# Patient Record
Sex: Female | Born: 1995 | Race: Black or African American | Hispanic: No | Marital: Single | Smoking: Never smoker
Health system: Southern US, Community
[De-identification: ages and names within clinical notes are randomized; demographics above are authoritative.]

## PROBLEM LIST (undated history)

## (undated) DIAGNOSIS — O039 Complete or unspecified spontaneous abortion without complication: Secondary | ICD-10-CM

---

## 2013-11-27 ENCOUNTER — Encounter (HOSPITAL_COMMUNITY): Payer: Self-pay | Admitting: Emergency Medicine

## 2013-11-27 ENCOUNTER — Emergency Department (HOSPITAL_COMMUNITY)
Admission: EM | Admit: 2013-11-27 | Discharge: 2013-11-27 | Disposition: A | Discharge status: Home / Self Care | Payer: BC Managed Care – PPO | Attending: Emergency Medicine | Admitting: Emergency Medicine

## 2013-11-27 ENCOUNTER — Emergency Department (HOSPITAL_COMMUNITY): Payer: BC Managed Care – PPO

## 2013-11-27 DIAGNOSIS — Z880 Allergy status to penicillin: Secondary | ICD-10-CM | POA: Diagnosis not present

## 2013-11-27 DIAGNOSIS — M79671 Pain in right foot: Secondary | ICD-10-CM | POA: Insufficient documentation

## 2013-11-27 DIAGNOSIS — M79674 Pain in right toe(s): Secondary | ICD-10-CM | POA: Diagnosis present

## 2013-11-27 DIAGNOSIS — Z79899 Other long term (current) drug therapy: Secondary | ICD-10-CM | POA: Diagnosis not present

## 2013-11-27 NOTE — ED Provider Notes (Signed)
CSN: [id number]     Arrival date & time 11/27/13  1527 History  This chart was scribed for Jinny Sanders, PA-C working with Richardean Canal, MD by Evon Slack, ED Scribe. This patient was seen in room TR08C/TR08C and the patient's care was started at 4:09 PM.      Chief Complaint  Patient presents with  . Toe Pain   Patient is a 18 y.o. female presenting with toe pain. The history is provided by the patient. No language interpreter was used.  Toe Pain This is a new problem. The current episode started more than 1 week ago. The symptoms are aggravated by walking. Pam Krueger has tried nothing for the symptoms.   HPI Comments: Pam Krueger is a 18 y.o. female who presents to the Emergency Department complaining of intermittent right greater toe pain onset 3 months prior. Pam Krueger states Pam Krueger initially stepped on a ear ring 3 months ago. Pam Krueger states that her symptoms have worsened over the past 3 weeks. Pam Krueger states that the toe is painful to walk on. Pam Krueger states that the toe is almost more painful. Pam Krueger states that Pam Krueger noticed that over the weekend it was slightly green in color.   History reviewed. No pertinent past medical history. History reviewed. No pertinent past surgical history. History reviewed. No pertinent family history. History  Substance Use Topics  . Smoking status: Never Smoker   . Smokeless tobacco: Not on file  . Alcohol Use: No   OB History   Grav Para Term Preterm Abortions TAB SAB Ect Mult Living                 Review of Systems  Musculoskeletal: Positive for arthralgias (right great toe). Negative for gait problem.  Skin: Positive for color change.    Allergies  Penicillins  Home Medications   Prior to Admission medications   Medication Sig Start Date End Date Taking? Authorizing Provider  albuterol (PROVENTIL HFA;VENTOLIN HFA) 108 (90 BASE) MCG/ACT inhaler Inhale 1-2 puffs into the lungs every 6 (six) hours as needed for shortness of breath.   Yes Historical  Provider, MD  cetirizine (ZYRTEC) 10 MG tablet Take 10 mg by mouth daily.   Yes Historical Provider, MD   Triage Vitals: BP 99/57  Pulse 75  Temp(Src) 97.7 F (36.5 C) (Oral)  Resp 16  Ht 5' (1.524 m)  Wt 130 lb 4 oz (59.081 kg)  BMI 25.44 kg/m2  SpO2 97%  LMP 11/20/2013  Physical Exam  Nursing note and vitals reviewed. Constitutional: Pam Krueger is oriented to person, place, and time. Pam Krueger appears well-developed and well-nourished. No distress.  HENT:  Head: Normocephalic and atraumatic.  Eyes: Conjunctivae and EOM are normal.  Neck: Neck supple. No tracheal deviation present.  Cardiovascular: Normal rate.   Pulmonary/Chest: Effort normal. No respiratory distress.  Musculoskeletal: Normal range of motion.  Neurological: Pam Krueger is alert and oriented to person, place, and time.  Skin: Skin is warm and dry.  Right toe: 1/4 cm closed non erythematous, non-edematous, non-swollen, well-healed puncture wound. No obvious drainage, purulence, erythema, signs of infection.  Psychiatric: Pam Krueger has a normal mood and affect. Her behavior is normal.    ED Course  Procedures (including critical care time) DIAGNOSTIC STUDIES: Oxygen Saturation is 97% on RA, normal by my interpretation.    COORDINATION OF CARE: 4:10 PM-Discussed treatment plan which includes x ray of great right toe with pt at bedside and pt agreed to plan.     Labs Review Labs Reviewed -  No data to display  Imaging Review Dg Foot 2 Views Right  11/27/2013   CLINICAL DATA:  Right great toe pain for 3 months  EXAM: RIGHT FOOT - 2 VIEW  COMPARISON:  None.  FINDINGS: Two views of the right foot submitted. No acute fracture or subluxation. No periosteal reaction or bony erosion.  IMPRESSION: Negative.   Electronically Signed   By: Natasha MeadLiviu  Pop M.D.   On: 11/27/2013 16:53     EKG Interpretation None      MDM   Final diagnoses:  Foot pain, right   Patient presenting today with 3 months of pain in her great toe and her right  foot. Patient reports receiving a puncture wound in the toe after stepping on an earring. Patient reports being able to visualize the entire hearing when Pam Krueger pulled it out, did not notice a breaking off in her foot. Patient denies any erythema, warmth, drainage from her toe. Patient reports that Pam Krueger wears high heels on a regular basis, and high heels have been causing the wound to become painful. On exam there is a well-healed wound with obvious callus formation around where the puncture site was. No obvious signs of infection, injury. No signs of poorly healing wound tissue. Patient encouraged to use warm soaks, ibuprofen/Tylenol for pain, and to followup with primary care physician. I gave patient return precautions and encourage her to call or return to the ER should Pam Krueger have any questions or concerns.  BP 99/57  Pulse 75  Temp(Src) 97.7 F (36.5 C) (Oral)  Resp 16  Ht 5' (1.524 m)  Wt 130 lb 4 oz (59.081 kg)  BMI 25.44 kg/m2  SpO2 97%  LMP 11/20/2013  Signed,  Ladona MowJoe Yovanny Coats, PA-C 4:03 AM  I personally performed the services described in this documentation, which was scribed in my presence. The recorded information has been reviewed and is accurate.      Monte FantasiaJoseph W Secundino Ellithorpe, PA-C 11/28/13 [id number]

## 2013-11-27 NOTE — ED Notes (Signed)
Declined W/C at D/C and was escorted to lobby by RN. 

## 2013-11-27 NOTE — Discharge Instructions (Signed)

## 2013-11-27 NOTE — ED Notes (Signed)
Per pt sts that a few months ago stepped on an earring and right big toe still hurting.

## 2013-11-28 NOTE — ED Provider Notes (Signed)
Medical screening examination/treatment/procedure(s) were performed by non-physician practitioner and as supervising physician I was immediately available for consultation/collaboration.   EKG Interpretation None        Richardean Canalavid H Yao, MD 11/28/13 2202

## 2013-12-14 ENCOUNTER — Emergency Department (HOSPITAL_COMMUNITY)
Admission: EM | Admit: 2013-12-14 | Discharge: 2013-12-14 | Disposition: A | Discharge status: Home / Self Care | Payer: BC Managed Care – PPO | Attending: Emergency Medicine | Admitting: Emergency Medicine

## 2013-12-14 ENCOUNTER — Emergency Department (HOSPITAL_COMMUNITY): Payer: BC Managed Care – PPO

## 2013-12-14 ENCOUNTER — Encounter (HOSPITAL_COMMUNITY): Payer: Self-pay | Admitting: Emergency Medicine

## 2013-12-14 DIAGNOSIS — W19XXXA Unspecified fall, initial encounter: Secondary | ICD-10-CM

## 2013-12-14 DIAGNOSIS — B070 Plantar wart: Secondary | ICD-10-CM

## 2013-12-14 DIAGNOSIS — S3982XA Other specified injuries of lower back, initial encounter: Secondary | ICD-10-CM | POA: Diagnosis not present

## 2013-12-14 DIAGNOSIS — Z880 Allergy status to penicillin: Secondary | ICD-10-CM | POA: Diagnosis not present

## 2013-12-14 DIAGNOSIS — M545 Low back pain, unspecified: Secondary | ICD-10-CM

## 2013-12-14 DIAGNOSIS — W109XXA Fall (on) (from) unspecified stairs and steps, initial encounter: Secondary | ICD-10-CM | POA: Diagnosis not present

## 2013-12-14 DIAGNOSIS — S91202A Unspecified open wound of left great toe with damage to nail, initial encounter: Secondary | ICD-10-CM | POA: Insufficient documentation

## 2013-12-14 DIAGNOSIS — Z79899 Other long term (current) drug therapy: Secondary | ICD-10-CM | POA: Insufficient documentation

## 2013-12-14 DIAGNOSIS — Y9289 Other specified places as the place of occurrence of the external cause: Secondary | ICD-10-CM | POA: Diagnosis not present

## 2013-12-14 DIAGNOSIS — Y9389 Activity, other specified: Secondary | ICD-10-CM | POA: Insufficient documentation

## 2013-12-14 DIAGNOSIS — T1490XA Injury, unspecified, initial encounter: Secondary | ICD-10-CM

## 2013-12-14 DIAGNOSIS — S91209A Unspecified open wound of unspecified toe(s) with damage to nail, initial encounter: Secondary | ICD-10-CM

## 2013-12-14 DIAGNOSIS — S99921A Unspecified injury of right foot, initial encounter: Secondary | ICD-10-CM | POA: Diagnosis present

## 2013-12-14 MED ORDER — OXYCODONE-ACETAMINOPHEN 5-325 MG PO TABS
1.0000 | ORAL_TABLET | Freq: Once | ORAL | Status: AC
  Administered 2013-12-14: 1 via ORAL
  Filled 2013-12-14: qty 1

## 2013-12-14 MED ORDER — LIDOCAINE HCL (PF) 1 % IJ SOLN
5.0000 mL | Freq: Once | INTRAMUSCULAR | Status: AC
  Administered 2013-12-14: 5 mL via INTRADERMAL
  Filled 2013-12-14: qty 5

## 2013-12-14 NOTE — ED Notes (Signed)
Suture cart placed at bedside. 

## 2013-12-14 NOTE — ED Notes (Signed)
When tripped down steps, lt. toe nail (acrylic and natural) avulsed.  Mild bleeding. Back hurts a little bit. The foot got caught in the steps.

## 2013-12-14 NOTE — ED Provider Notes (Signed)
CSN: [id number]     Arrival date & time 12/14/13  1736 History  This chart was scribed for non-physician practitioner, Dierdre Forth, PA-C, working with Audree Camel, MD, by Bronson Curb, ED Scribe. This patient was seen in room TR09C/TR09C and the patient's care was started at 9:11 PM.     Chief Complaint  Patient presents with  . Nail Problem  . Toe Injury    The history is provided by the patient and medical records. No language interpreter was used.    HPI Comments: Pam Krueger is a 18 y.o. female who presents to the Emergency Department complaining of left great toe injury that occurred PTA. Patient states she tripped down the stairs when her toe,  became caught between the steps. She states that, in her attempt to dislodge her foot, the toe nail (acrylic and natural) detached from her toe. There is associated moderate pain and minimal bleeding at the site, in addition to mild back pain. She denies any numbness or tingling, no radiation of her back pain, no bowel or bladder incontinence, no weakness or saddle anesthesia. She denies head impact/injury or LOC. Patient has no history of significant health conditions.   History reviewed. No pertinent past medical history. History reviewed. No pertinent past surgical history. History reviewed. No pertinent family history. History  Substance Use Topics  . Smoking status: Never Smoker   . Smokeless tobacco: Not on file  . Alcohol Use: No   OB History   Grav Para Term Preterm Abortions TAB SAB Ect Mult Living                 Review of Systems  Constitutional: Negative for fever, chills and fatigue.  Respiratory: Negative for chest tightness and shortness of breath.   Cardiovascular: Negative for chest pain.  Gastrointestinal: Negative for nausea, vomiting, abdominal pain and diarrhea.  Genitourinary: Negative for dysuria, urgency, frequency and hematuria.  Musculoskeletal: Positive for arthralgias ( Left  great toe), back pain and myalgias (left great toe). Negative for gait problem, joint swelling, neck pain and neck stiffness.  Skin: Positive for wound (left great toe). Negative for rash.  Neurological: Negative for weakness, light-headedness, numbness and headaches.  Hematological: Does not bruise/bleed easily.  Psychiatric/Behavioral: The patient is not nervous/anxious.   All other systems reviewed and are negative.     Allergies  Penicillins  Home Medications   Prior to Admission medications   Medication Sig Start Date End Date Taking? Authorizing Provider  albuterol (PROVENTIL HFA;VENTOLIN HFA) 108 (90 BASE) MCG/ACT inhaler Inhale 1-2 puffs into the lungs every 6 (six) hours as needed for shortness of breath.    Historical Provider, MD  cetirizine (ZYRTEC) 10 MG tablet Take 10 mg by mouth daily.    Historical Provider, MD   BP 111/54  Pulse 91  Temp(Src) 98.6 F (37 C) (Oral)  Resp 18  SpO2 100%  LMP 11/20/2013 Physical Exam  Nursing note and vitals reviewed. Constitutional: She appears well-developed and well-nourished. No distress.  HENT:  Head: Normocephalic and atraumatic.  Mouth/Throat: Oropharynx is clear and moist. No oropharyngeal exudate.  Eyes: Conjunctivae are normal.  Neck: Normal range of motion. Neck supple.  Full ROM without pain  Cardiovascular: Normal rate, regular rhythm and intact distal pulses.   Capillary refill less than 3 seconds  Pulmonary/Chest: Effort normal and breath sounds normal. No respiratory distress. She has no wheezes.  Abdominal: Soft. She exhibits no distension. There is no tenderness.  Musculoskeletal: She exhibits tenderness.  She exhibits no edema.       Feet:  Full range of motion of the T-spine and L-spine No tenderness to palpation of the spinous processes of the T-spine or L-spine Very mild tenderness to bilateral palpation of the paraspinous muscles of the L-spine TTP of the left great toenail bed; nail partially avulsed  without laceration to the nailbed, mild TTP of the great toe, no joint line tenderness Plantar wart located on the plantar surface of the right great toe without secondary infection  Lymphadenopathy:    She has no cervical adenopathy.  Neurological: She is alert. She has normal reflexes. Coordination normal.  Reflex Scores:      Bicep reflexes are 2+ on the right side and 2+ on the left side.      Brachioradialis reflexes are 2+ on the right side and 2+ on the left side.      Patellar reflexes are 2+ on the right side and 2+ on the left side.      Achilles reflexes are 2+ on the right side and 2+ on the left side. Speech is clear and goal oriented, follows commands Normal 5/5 strength in upper and lower extremities bilaterally including dorsiflexion and plantar flexion, strong and equal grip strength Sensation normal to light and sharp touch Moves extremities without ataxia, coordination intact Normal gait Normal balance No Clonus   Skin: Skin is warm and dry. No rash noted. She is not diaphoretic. No erythema.  No tenting of the skin  Psychiatric: She has a normal mood and affect. Her behavior is normal.    ED Course  NAIL REMOVAL Date/Time: 12/14/2013 7:04 PM Performed by: Dierdre ForthMUTHERSBAUGH, Lashandra Arauz Authorized by: Dierdre ForthMUTHERSBAUGH, Teliyah Royal Consent: Verbal consent obtained. Risks and benefits: risks, benefits and alternatives were discussed Consent given by: patient Patient understanding: patient states understanding of the procedure being performed Patient consent: the patient's understanding of the procedure matches consent given Procedure consent: procedure consent matches procedure scheduled Relevant documents: relevant documents present and verified Site marked: the operative site was marked Imaging studies: imaging studies available Required items: required blood products, implants, devices, and special equipment available Patient identity confirmed: verbally with patient and arm  band Time out: Immediately prior to procedure a "time out" was called to verify the correct patient, procedure, equipment, support staff and site/side marked as required. Location: left foot Location details: left big toe Anesthesia: digital block Local anesthetic: lidocaine 1% without epinephrine Anesthetic total: 5 ml Patient sedated: no Preparation: skin prepped with alcohol Amount removed: complete Wedge excision of skin of nail fold: yes Nail bed sutured: no Nail matrix removed: none Removed nail replaced and anchored: no Dressing: petrolatum-impregnated gauze Patient tolerance: Patient tolerated the procedure well with no immediate complications. Comments: Removal of left great toenail, splinting of the nail fold with petroleum gauze without complication. No laceration to the nailbed.   (including critical care time)  DIAGNOSTIC STUDIES: Oxygen Saturation is 100% on room air, normal by my interpretation.    COORDINATION OF CARE: At 1825 Discussed treatment plan with patient which includes toe nail extraction and imaging. Patient agrees.   Labs Review Labs Reviewed - No data to display  Imaging Review Dg Toe Great Left  12/14/2013   CLINICAL DATA:  Acute left great toe pain with a nail bed injury after fall down stairs today. Initial encounter, trauma.  EXAM: LEFT GREAT TOE  COMPARISON:  None.  FINDINGS: No definite acute osseous or joint abnormality. The toenail is retracted from the nail bed.  IMPRESSION: 1. No definite osseous or joint abnormality. 2. Toenail is retracted from the nail bed.   Electronically Signed   By: Leanna BattlesMelinda  Blietz M.D.   On: 12/14/2013 18:48     EKG Interpretation None      Medications  lidocaine (PF) (XYLOCAINE) 1 % injection 5 mL (5 mLs Intradermal Given 12/14/13 1812)  oxyCODONE-acetaminophen (PERCOCET/ROXICET) 5-325 MG per tablet 1 tablet (1 tablet Oral Given 12/14/13 1812)     MDM   Final diagnoses:  Trauma  Toenail avulsion,  initial encounter  Plantar wart, right foot  Fall, initial encounter  Bilateral low back pain without sciatica   Marzetta BoardKiara Lowther Jenkins presents after fall with low back pain and left great toe avulsion.  Patient with back pain.  No neurological deficits and normal neuro exam.  Patient can walk without difficulty or pain.  No loss of bowel or bladder control.  No concern for cauda equina.  No midline tenderness, no indication for imaging at this time. No fever, night sweats, weight loss, h/o cancer, IVDU.  RICE protocol and pain medicine indicated and discussed with patient.   Patient with avulsion of left great toenail during fall. The toenail removed and nail folds splinted with petroleum gauze. He denied diabetes, HIV or immunosuppression to delay wound healing. No laceration to the nailbed.  Wound care discussed at length. Patient is to followup with her primary care, urgent care or back emergency department for worsening symptoms.  I have personally reviewed patient's vitals, nursing note and any pertinent labs or imaging.  I performed an focused physical exam; undressed when appropriate .    It has been determined that no acute conditions requiring further emergency intervention are present at this time. The patient/guardian have been advised of the diagnosis and plan. I reviewed any labs and imaging including any potential incidental findings. We have discussed signs and symptoms that warrant return to the ED and they are listed in the discharge instructions.    Vital signs are stable at discharge.   BP 111/54  Pulse 91  Temp(Src) 98.6 F (37 C) (Oral)  Resp 18  SpO2 100%  LMP 11/20/2013  I personally performed the services described in this documentation, which was scribed in my presence. The recorded information has been reviewed and is accurate.   Dahlia ClientHannah Malavika Lira, PA-C 12/14/13 2113

## 2013-12-14 NOTE — ED Notes (Signed)
Declined W/C at D/C and was escorted to lobby by RN. 

## 2013-12-14 NOTE — Discharge Instructions (Signed)
1. Medications: Tylenol or ibuprofen for pain, usual home medications 2. Treatment: ice for swelling, keep wound clean with warm soap and water and keep bandage dry, do not submerge in water for 24 hours 3. Follow Up: Please see your primary care physician in 3 days for wound check or sooner if you have concerns. Return to the emergency department for increased redness, drainage of pus from the wound   WOUND CARE  Keep area clean and dry for 24 hours. Do not remove bandage, if applied.  After 24 hours, remove bandage and wash wound gently with mild soap and warm water. Reapply a new bandage after cleaning wound, if directed.   Continue daily cleansing with soap and water until stitches/staples are removed.  Do not apply any ointments or creams to the wound while stitches/staples are in place, as this may cause delayed healing. Return if you experience any of the following signs of infection: Swelling, redness, pus drainage, streaking, fever >101.0 F  Return if you experience excessive bleeding that does not stop after 15-20 minutes of constant, firm pressure.    Emergency Department Resource Guide 1) Find a Doctor and Pay Out of Pocket Although you won't have to find out who is covered by your insurance plan, it is a good idea to ask around and get recommendations. You will then need to call the office and see if the doctor you have chosen will accept you as a new patient and what types of options they offer for patients who are self-pay. Some doctors offer discounts or will set up payment plans for their patients who do not have insurance, but you will need to ask so you aren't surprised when you get to your appointment.  2) Contact Your Local Health Department Not all health departments have doctors that can see patients for sick visits, but many do, so it is worth a call to see if yours does. If you don't know where your local health department is, you can check in your phone book. The  CDC also has a tool to help you locate your state's health department, and many state websites also have listings of all of their local health departments.  3) Find a Walk-in Clinic If your illness is not likely to be very severe or complicated, you may want to try a walk in clinic. These are popping up all over the country in pharmacies, drugstores, and shopping centers. They're usually staffed by nurse practitioners or physician assistants that have been trained to treat common illnesses and complaints. They're usually fairly quick and inexpensive. However, if you have serious medical issues or chronic medical problems, these are probably not your best option.  No Primary Care Doctor: - Call Health Connect at  [phone] - they can help you locate a primary care doctor that  accepts your insurance, provides certain services, etc. - Physician Referral Service- [phone]  Chronic Pain Problems: Organization         Address  Phone   Notes  [facility]  [phone] Patients need to be referred by their primary care doctor.   Medication Assistance: Organization         Address  Phone   Notes  [facility] [address] [place] [place], [place] [address] [phone] --Must be a resident of [facility] -- Must have NO insurance coverage whatsoever (no Medicaid/ Medicare, etc.) -- The pt. MUST have a primary care doctor that directs their  care regularly and follows them in the community   [facility]ist  [phone]   [employer]  [phone]) [phone]    Agencies that provide inexpensive medical care: Organization         Address  Phone   Notes  [facility]  [phone]   [facility]    [phone]) [phone]   [facility] [address] [place], [place] [address] [phone]) [phone]   [facility] [place] [address], [place] [phone]   [facility]anned Parenthood    [phone](336)  [phone]   [facility]    [phone]   [facility]  [address], [place] Phone:  [phone], Fax:  [phone] Hours of Operation:  9 am - 6 pm, M-F.  Also accepts Medicaid/Medicare and self-pay.  [facility]  [address], [place] Phone: [phone], Fax: [phone]. Hours of Operation:  8:30 am - 5:30 pm, M-F.  Also accepts Medicaid and self-pay.  [facility] [place] [address], [place] Phone: [phone]) [phone]   [facility] [address], [place], [place] [phone], Ext. [phone] Mondays & Thursdays: 7-9 AM.  First 15 patients are seen on a first come, first serve basis.    Medicaid-accepting [facility] Providers:  Organization         Address  Phone   Notes  [facility] [address], [place] [phone] Also accepts self-pay patients.  [facility] [address], [place]  [phone]) [phone]   [facility] [address], [place] [phone]) [phone]   [facility] [address] [place] [address], [place] [phone]) [phone]   Renaye RakersVeita Bland [address], [place]   [phone]) [phone] Only accepts [place] Access [place] patients after they have their name applied to their card.   Self-Pay (no insurance) in [facility]:  Organization         Address  Phone   Notes  Sickle Cell Patients, [facility] [address], [place] [phone]) [phone]   [facility] [address], [place] [phone]) [phone]   [facility] [place]  [address], [place] [phone]   [facility]/Dr. Osei-Bonsu  [address] [place] [address], [place] or [phone], [place] [phone]) [phone] Phone number for both [place] and [place] locations is the same.  [facility] [address], [place] [phone]   [facility] [address] [place] [address], [place] or [address] [phone]) [phone]   [facility] [address], [place] [phone]) [phone], phone; [phone](336) [phone], fax Sees patients 1st and 3rd Saturday of every month.  Must not qualify for public or private insurance (i.e. Medicaid, Medicare, [place] Health Choice, Veterans' Benefits)  Household income should be no more than 200% of the poverty level The clinic cannot treat you if you are pregnant or think you are pregnant  Sexually transmitted diseases are not treated at the clinic.    Dental Care: Organization         Address  Phone  Notes  [facility] of [facility] [address], [place] [phone]) [phone] Accepts children up to age  21 who are enrolled in Medicaid or [place] Health Choice; pregnant women with a Medicaid card; and children who have applied for Medicaid or [place] Health Choice, but were declined, whose parents can pay a reduced fee at time of service.  [facility] of [facility] [place]  [address], [place] [phone] Accepts children up to age 20 who are enrolled in [place] or [place] Health Choice; pregnant women with a Medicaid card; and children who have applied for Medicaid or [place] Health Choice, but were declined, whose parents can pay a reduced fee at time of service.  [facility]  [address], [place] [phone] Patients are seen by appointment only. Walk-ins are not accepted. [facility] will see patients 518 years of age and older. Monday - Tuesday (8am-5pm) Most Wednesdays (8:30-5pm) $30 per visit, cash only  [facility]  [address], [facility] [phone]) [phone] Patients are seen by appointment only. Walk-ins are not accepted. [facility] will see patients 20 years of age and older. One  Wednesday Evening (Monthly: Volunteer Based).  $30 per visit, cash only  [employer] [facility] [facility]  [phone] for adults; Children under age 204, call Graduate Pediatric Dentistry at [phone]. Children aged 84-14, please call [phone] to request a pediatric application.  Dental services are provided in all areas of dental care including fillings, crowns and bridges, complete and partial dentures, implants, gum treatment, root canals, and extractions. Preventive care is also provided. Treatment is provided to both adults and children. Patients are selected via a lottery and there is often a waiting list.   [facility] [address], [place]  [phone] [email or web address]   [facility] [address], [place], [place] [phone], Ext. [phone] Second and Fourth Thursday of each month, opens at 6:30 AM; Clinic ends at 9 AM.  Patients are seen on a first-come first-served basis, and a limited number are seen during each clinic.   [facility]  [address], [place], [place] [phone]) [phone]   Eligibility Requirements You must have lived in [place], [place], or [place] counties for at least the last three months.   You cannot be eligible for state or federal sponsored National Cityhealthcare insurance, including CIGNAVeterans Administration, [place]icaid, or [employer]Medicare.   You generally cannot be eligible for healthcare insurance through your employer.    How to apply: Eligibility screenings are held every Tuesday and Wednesday afternoon from 1:00 pm until 4:00 pm. You do not need an appointment for the interview!  [facility] [address], [place], [place] [phone]   [facility]  [phone]   [facility]  [phone]   [facility]  [phone]    Behavioral Health Resources in the Community: Intensive Outpatient Programs Organization          Address  Phone  Notes  [facility] [address], [place], [place] [phone]   [facility] [address], [place], [place] [phone]   [facility] [address], [place], [place]  [phone]   [facility] [address],  [place], [place] [phone] or [phone]   Substance Abuse Resources Organization         Address  Phone  Notes  [facility]  [phone]   [facility]  [phone]   [employer] [facility]  [phone]   Floydene Flock  [phone]   Residential & Outpatient Substance Abuse Program  [phone]   Psychological Services Organization         Address  Phone  Notes  [facility]  [phone]   [facility]  [phone]   [facility] [address], [place] [phone] or [phone]    Mobile Crisis Teams Organization         Address  Phone  Notes  [facility], Mobile Crisis Care Unit  [phone]   [facility]  [address] [place], [place] [phone]   Doristine Locks [address] [place] [phone]    Self-Help/Support Groups Organization         Address  Phone             Notes  [facility] [place] - variety of support groups  336- I7437963 Call for more information  Narcotics Anonymous (NA), [facility] [address], [employer] [place]  2 meetings at this location   Statistician         Address  Phone  Notes  [facility] [address],    [place]  [phone]   [facility]  [address], [place] [address], [place], [place] [phone]   [facility] [address] [place], [place] [phone] Admissions: 8am-3pm M-F  [facility] [address]    [place], [place] [phone]   [facility] [address] [place], [place], [place] [phone]   [facility] [address],  [place], [place] [phone]   [facility] - Intensive Outpatient [address], [place], [place] [phone]   [facility] ([facility] [address]  [place], [place] [phone] or [phone]   [facility] ([facility]) [address], [place], [place] [phone] Accepts Medicaid  [facility] [address]  [place] [phone] Substance Abuse/Addiction Treatment   [facility] Organization         Address  Phone  Notes  [facility]  [phone]   Angie Fava, PhD [address] [place], [place]   [phone] or [phone]   [facility]   [address] [place], [place] [phone]   [facility] [address] [place], [place] [phone] Insurance/Medicaid/sponsorship through [facility] and Families [address]                                    [place], [place] [phone] Therapy/tele-psych/case  [facility] [address], [place] [phone]    Dr. Lolly Mustache  [phone]   [facility] [place]  [employer] [facility]. 1) [address] [place] 2) [address], [place] 3)  [address], [place] [phone]  [phone]   [facility] Child Abuse Hotline ([phone] or [phone] (After Hours)

## 2013-12-16 NOTE — ED Provider Notes (Signed)
Medical screening examination/treatment/procedure(s) were performed by non-physician practitioner and as supervising physician I was immediately available for consultation/collaboration.   EKG Interpretation None        Erina Hamme T Osaze Hubbert, MD 12/16/13 1510 

## 2014-01-24 ENCOUNTER — Emergency Department (HOSPITAL_COMMUNITY): Payer: BC Managed Care – PPO

## 2014-01-24 ENCOUNTER — Encounter (HOSPITAL_COMMUNITY): Payer: Self-pay | Admitting: Family Medicine

## 2014-01-24 ENCOUNTER — Emergency Department (HOSPITAL_COMMUNITY)
Admission: EM | Admit: 2014-01-24 | Discharge: 2014-01-24 | Disposition: A | Discharge status: Home / Self Care | Payer: BC Managed Care – PPO | Attending: Emergency Medicine | Admitting: Emergency Medicine

## 2014-01-24 DIAGNOSIS — Z880 Allergy status to penicillin: Secondary | ICD-10-CM | POA: Insufficient documentation

## 2014-01-24 DIAGNOSIS — Z79899 Other long term (current) drug therapy: Secondary | ICD-10-CM | POA: Insufficient documentation

## 2014-01-24 DIAGNOSIS — O9989 Other specified diseases and conditions complicating pregnancy, childbirth and the puerperium: Secondary | ICD-10-CM | POA: Insufficient documentation

## 2014-01-24 DIAGNOSIS — R1084 Generalized abdominal pain: Secondary | ICD-10-CM | POA: Insufficient documentation

## 2014-01-24 DIAGNOSIS — N3000 Acute cystitis without hematuria: Secondary | ICD-10-CM

## 2014-01-24 DIAGNOSIS — Z3A09 9 weeks gestation of pregnancy: Secondary | ICD-10-CM | POA: Insufficient documentation

## 2014-01-24 DIAGNOSIS — Z3A00 Weeks of gestation of pregnancy not specified: Secondary | ICD-10-CM | POA: Diagnosis not present

## 2014-01-24 DIAGNOSIS — Z3490 Encounter for supervision of normal pregnancy, unspecified, unspecified trimester: Secondary | ICD-10-CM

## 2014-01-24 DIAGNOSIS — R109 Unspecified abdominal pain: Secondary | ICD-10-CM

## 2014-01-24 DIAGNOSIS — O2311 Infections of bladder in pregnancy, first trimester: Secondary | ICD-10-CM | POA: Diagnosis not present

## 2014-01-24 DIAGNOSIS — O210 Mild hyperemesis gravidarum: Secondary | ICD-10-CM | POA: Insufficient documentation

## 2014-01-24 LAB — URINE MICROSCOPIC-ADD ON

## 2014-01-24 LAB — CBC WITH DIFFERENTIAL/PLATELET
Basophils Absolute: 0 10*3/uL (ref 0.0–0.1)
Basophils Relative: 0 % (ref 0–1)
EOS PCT: 4 % (ref 0–5)
Eosinophils Absolute: 0.2 10*3/uL (ref 0.0–0.7)
HCT: 34.6 % — ABNORMAL LOW (ref 36.0–46.0)
Hemoglobin: 12.2 g/dL (ref 12.0–15.0)
LYMPHS ABS: 1.9 10*3/uL (ref 0.7–4.0)
Lymphocytes Relative: 35 % (ref 12–46)
MCH: 32.4 pg (ref 26.0–34.0)
MCHC: 35.3 g/dL (ref 30.0–36.0)
MCV: 92 fL (ref 78.0–100.0)
Monocytes Absolute: 0.5 10*3/uL (ref 0.1–1.0)
Monocytes Relative: 9 % (ref 3–12)
Neutro Abs: 2.9 10*3/uL (ref 1.7–7.7)
Neutrophils Relative %: 52 % (ref 43–77)
Platelets: 328 10*3/uL (ref 150–400)
RBC: 3.76 MIL/uL — ABNORMAL LOW (ref 3.87–5.11)
RDW: 11.6 % (ref 11.5–15.5)
WBC: 5.6 10*3/uL (ref 4.0–10.5)

## 2014-01-24 LAB — URINALYSIS, ROUTINE W REFLEX MICROSCOPIC
Bilirubin Urine: NEGATIVE
Glucose, UA: NEGATIVE mg/dL
HGB URINE DIPSTICK: NEGATIVE
Ketones, ur: 15 mg/dL — AB
Leukocytes, UA: NEGATIVE
Nitrite: POSITIVE — AB
Protein, ur: NEGATIVE mg/dL
SPECIFIC GRAVITY, URINE: 1.025 (ref 1.005–1.030)
Urobilinogen, UA: 2 mg/dL — ABNORMAL HIGH (ref 0.0–1.0)
pH: 7 (ref 5.0–8.0)

## 2014-01-24 LAB — WET PREP, GENITAL
CLUE CELLS WET PREP: NONE SEEN
TRICH WET PREP: NONE SEEN
WBC, Wet Prep HPF POC: NONE SEEN

## 2014-01-24 LAB — I-STAT BETA HCG BLOOD, ED (MC, WL, AP ONLY)

## 2014-01-24 LAB — BASIC METABOLIC PANEL
Anion gap: 13 (ref 5–15)
BUN: 4 mg/dL — ABNORMAL LOW (ref 6–23)
CALCIUM: 9 mg/dL (ref 8.4–10.5)
CO2: 18 meq/L — AB (ref 19–32)
CREATININE: 0.57 mg/dL (ref 0.50–1.10)
Chloride: 104 mEq/L (ref 96–112)
GFR calc Af Amer: >90 mL/min (ref >90)
GFR calc non Af Amer: >90 mL/min (ref >90)
GLUCOSE: 81 mg/dL (ref 70–99)
Potassium: 3.8 mEq/L (ref 3.7–5.3)
Sodium: 135 mEq/L — ABNORMAL LOW (ref 137–147)

## 2014-01-24 LAB — POC URINE PREG, ED: Preg Test, Ur: POSITIVE — AB

## 2014-01-24 MED ORDER — NITROFURANTOIN MONOHYD MACRO 100 MG PO CAPS: 100.0000 mg | ORAL_CAPSULE | Freq: Two times a day (BID) | ORAL | Status: DC

## 2014-01-24 NOTE — ED Notes (Signed)
Pt. Is stable upon d/c and will return home. Pt. Is ambulatory and was escorted off unit by this nurse. F/u care discussed with patient and she verbalized understanding via teachback method.

## 2014-01-24 NOTE — Discharge Instructions (Signed)
Please read and follow all provided instructions.  Your diagnoses today include:  1. Pregnancy   2. Abdominal pain   3. Acute cystitis without hematuria    Tests performed today include:  Blood counts and electrolytes  Blood tests to check kidney function  Urine test to look for infection and pregnancy (in women)  Ultrasound shows healthy pregnancy at 9 weeks and 5 days with due date of July 1st, 2016.   Vital signs. See below for your results today.   Medications prescribed:   Keflex (cephalexin) - antibiotic  You have been prescribed an antibiotic medicine: take the entire course of medicine even if you are feeling better. Stopping early can cause the antibiotic not to work.  Take any prescribed medications only as directed.  Home care instructions:   Follow any educational materials contained in this packet.  Follow-up instructions: Please follow-up with your primary care provider or gynecologist in the next 7 days for further evaluation of your symptoms.    Return instructions:  SEEK IMMEDIATE MEDICAL ATTENTION IF:  The pain does not go away or becomes severe   A temperature above 101F develops   Repeated vomiting occurs (multiple episodes)   The pain becomes localized to portions of the abdomen. The right side could possibly be appendicitis. In an adult, the left lower portion of the abdomen could be colitis or diverticulitis.   Blood is being passed in stools or vomit (bright red or black tarry stools)   You develop chest pain, difficulty breathing, dizziness or fainting, or become confused, poorly responsive, or inconsolable (young children)  If you have any other emergent concerns regarding your health  Additional Information: Abdominal (belly) pain can be caused by many things. Your caregiver performed an examination and possibly ordered blood/urine tests and imaging (CT scan, x-rays, ultrasound). Many cases can be observed and treated at home after  initial evaluation in the emergency department. Even though you are being discharged home, abdominal pain can be unpredictable. Therefore, you need a repeated exam if your pain does not resolve, returns, or worsens. Most patients with abdominal pain don't have to be admitted to the hospital or have surgery, but serious problems like appendicitis and gallbladder attacks can start out as nonspecific pain. Many abdominal conditions cannot be diagnosed in one visit, so follow-up evaluations are very important.  Your vital signs today were: BP 105/63 mmHg   Pulse 70   Temp(Src) 98.3 F (36.8 C)   Resp 17   SpO2 99%   LMP  If your blood pressure (bp) was elevated above 135/85 this visit, please have this repeated by your doctor within one month. --------------

## 2014-01-24 NOTE — ED Provider Notes (Signed)
4:30 PM: At shift change, hand-off report given by Rhea BleacherJosh Geiple, PA-C.  Pt awaiting lab results.  Plan includes discharge with aforementioned PA's plan is labs without significant abnormality.    5:15 PM: BMP and Wet prep resulted without significant abnormality.  Pt will be discharged with aforementioned PA's original plan.    Harle BattiestElizabeth Maze Corniel, NP 01/25/14 2354  Rolland PorterMark James, MD 01/29/14 2329

## 2014-01-24 NOTE — ED Provider Notes (Signed)
CSN: [id number]     Arrival date & time 01/24/14  1350 History   First MD Initiated Contact with Patient 01/24/14 1412     Chief Complaint  Patient presents with  . Abdominal Pain  . Nausea     (Consider location/radiation/quality/duration/timing/severity/associated sxs/prior Treatment) HPI Comments: Patient presents with complaint of generalized abdominal pain, worse lower for the past 4 weeks with associated nausea. Patient had one total episode of vomiting during this time. Abdominal pain is every day. Patient reports that she has had some episodes of severe lower abdominal pain and cramping. Patient reports shortness of breath with walking up a flight of stairs however does not have shortness of breath at rest. She has not had fever or cough. She has not had diarrhea. No dysuria patient admits to increased frequency. She has had more vaginal discharge than usual. No vaginal bleeding. Her last menstrual period was at least 4 weeks ago; she does not remember the exact date. No treatments PTA.   The history is provided by the patient.    History reviewed. No pertinent past medical history. History reviewed. No pertinent past surgical history. History reviewed. No pertinent family history. History  Substance Use Topics  . Smoking status: Never Smoker   . Smokeless tobacco: Not on file  . Alcohol Use: No   OB History    No data available     Review of Systems  Constitutional: Negative for fever.  HENT: Negative for rhinorrhea and sore throat.   Eyes: Negative for redness.  Respiratory: Positive for shortness of breath. Negative for cough.   Cardiovascular: Negative for chest pain.  Gastrointestinal: Positive for nausea, vomiting and abdominal pain. Negative for diarrhea.  Genitourinary: Positive for frequency and vaginal discharge. Negative for dysuria, vaginal bleeding and vaginal pain.  Musculoskeletal: Negative for myalgias.  Skin: Negative for rash.  Neurological: Negative  for headaches.   Allergies  Penicillins  Home Medications   Prior to Admission medications   Medication Sig Start Date End Date Taking? Authorizing Provider  albuterol (PROVENTIL HFA;VENTOLIN HFA) 108 (90 BASE) MCG/ACT inhaler Inhale 1-2 puffs into the lungs every 6 (six) hours as needed for shortness of breath.    Historical Provider, MD  cetirizine (ZYRTEC) 10 MG tablet Take 10 mg by mouth daily.    Historical Provider, MD   BP 113/53 mmHg  Pulse 74  Temp(Src) 98.3 F (36.8 C)  Resp 16  SpO2 100%  LMP    Physical Exam  Constitutional: She appears well-developed and well-nourished.  HENT:  Head: Normocephalic and atraumatic.  Eyes: Conjunctivae are normal. Right eye exhibits no discharge. Left eye exhibits no discharge.  Neck: Normal range of motion. Neck supple.  Cardiovascular: Normal rate, regular rhythm and normal heart sounds.   No murmur heard. Pulmonary/Chest: Effort normal and breath sounds normal. No respiratory distress. She has no wheezes. She has no rales.  Abdominal: Soft. She exhibits no distension. There is tenderness (mild, lower). There is no rebound and no guarding.  Genitourinary: Uterus is enlarged. Cervix exhibits no motion tenderness, no discharge and no friability. Right adnexum displays no mass and no tenderness. Left adnexum displays no mass and no tenderness. No bleeding in the vagina. Vaginal discharge (scant, thick white) found.  Neurological: She is alert.  Skin: Skin is warm and dry.  Psychiatric: She has a normal mood and affect.  Nursing note and vitals reviewed.   ED Course  Procedures (including critical care time) Labs Review Labs Reviewed  URINALYSIS, ROUTINE  W REFLEX MICROSCOPIC - Abnormal; Notable for the following:    Color, Urine AMBER (*)    APPearance CLOUDY (*)    Ketones, ur 15 (*)    Urobilinogen, UA 2.0 (*)    Nitrite POSITIVE (*)    All other components within normal limits  CBC WITH DIFFERENTIAL - Abnormal; Notable for  the following:    RBC 3.76 (*)    HCT 34.6 (*)    All other components within normal limits  URINE MICROSCOPIC-ADD ON - Abnormal; Notable for the following:    Squamous Epithelial / LPF MANY (*)    Bacteria, UA MANY (*)    All other components within normal limits  POC URINE PREG, ED - Abnormal; Notable for the following:    Preg Test, Ur POSITIVE (*)    All other components within normal limits  WET PREP, GENITAL  GC/CHLAMYDIA PROBE AMP  URINE CULTURE  BASIC METABOLIC PANEL  I-STAT BETA HCG BLOOD, ED ([facility], [facility], [facility] ONLY)    Imaging Review [place] Ob Comp Less 14 Wks  01/24/2014   CLINICAL DATA:  Pregnancy, abdominal pain.  EXAM: OBSTETRIC <14 WK US AND TRANSVAGINAL OB US  TECHNIQUE: Both transabdominal and transvaginal ultrasound examinations were performed for complete evaluation of the gestation as well as the maternal uterus, adnexal regions, and pelvic cul-de-sac. Transvaginal technique was performed to assess early pregnancy.  COMPARISON:  None.  FINDINGS: Intrauterine gestational sac: Visualized/normal in shape.  Yolk sac:  Visualized.  Embryo:  Visualized.  Cardiac Activity: Visualized.  Heart Rate:  173 bpm  CRL:   28.3  mm   9 w 5 d                  US EDC: August 24, 2014.  Maternal uterus/adnexae: Ovaries appear normal. No hemorrhage is noted.  IMPRESSION: Single live intrauterine gestation of 9 weeks 5 days.   Electronically Signed   By: Roque LiasJames  Green M.D.   On: 01/24/2014 15:58   [place] Ob Transvaginal  01/24/2014   CLINICAL DATA:  Pregnancy, abdominal pain.  EXAM: OBSTETRIC <14 WK US AND TRANSVAGINAL OB US  TECHNIQUE: Both transabdominal and transvaginal ultrasound examinations were performed for complete evaluation of the gestation as well as the maternal uterus, adnexal regions, and pelvic cul-de-sac. Transvaginal technique was performed to assess early pregnancy.  COMPARISON:  None.  FINDINGS: Intrauterine gestational sac: Visualized/normal in shape.  Yolk sac:  Visualized.  Embryo:   Visualized.  Cardiac Activity: Visualized.  Heart Rate:  173 bpm  CRL:   28.3  mm   9 w 5 d                  US EDC: August 24, 2014.  Maternal uterus/adnexae: Ovaries appear normal. No hemorrhage is noted.  IMPRESSION: Single live intrauterine gestation of 9 weeks 5 days.   Electronically Signed   By: Roque LiasJames  Green M.D.   On: 01/24/2014 15:58     EKG Interpretation None       2:42 PM Patient seen and examined. Patient is currently comfortable, but with report of lower abd pain, severe at times, will get US to ensure uterine pregnancy.   Vital signs reviewed and are as follows: BP 113/53 mmHg  Pulse 74  Temp(Src) 98.3 F (36.8 C)  Resp 16  SpO2 100%  LMP   4:22 PM Pt informed of imaging results. UA shows UTI and will treat given pregnancy.   Pelvic performed by Brixius PA-S2 under my supervision.  Pending blood/wet prep results. Handoff to [employer] PA-C who will check on pending results. Otherwise plan as above. She appears well, stable.    MDM   Final diagnoses:  Abdominal pain  Pregnancy  Acute cystitis without hematuria   Abdominal pain in pregnancy. UTI noted, culture sent. IUP confirmed. Pt safe for dc to home. No concern for intraabominal problem given patient's history.    Renne CriglerJoshua Chaselyn Nanney, PA-C 01/24/14 1632  Tilden FossaElizabeth Rees, MD 01/24/14 [phone]

## 2014-01-24 NOTE — ED Notes (Signed)
Per pt sts 4 weeks of abdominal pain and nausea. sts generalized.

## 2014-01-25 LAB — URINE CULTURE
COLONY COUNT: NO GROWTH
CULTURE: NO GROWTH
Special Requests: NORMAL

## 2014-01-25 LAB — GC/CHLAMYDIA PROBE AMP
CT PROBE, AMP APTIMA: NEGATIVE
GC Probe RNA: NEGATIVE

## 2015-07-07 ENCOUNTER — Emergency Department (HOSPITAL_COMMUNITY): Payer: Managed Care, Other (non HMO)

## 2015-07-07 ENCOUNTER — Emergency Department (HOSPITAL_COMMUNITY)
Admission: EM | Admit: 2015-07-07 | Discharge: 2015-07-07 | Disposition: A | Discharge status: Home / Self Care | Payer: Managed Care, Other (non HMO) | Attending: Emergency Medicine | Admitting: Emergency Medicine

## 2015-07-07 ENCOUNTER — Encounter (HOSPITAL_COMMUNITY): Payer: Self-pay | Admitting: *Deleted

## 2015-07-07 DIAGNOSIS — J45909 Unspecified asthma, uncomplicated: Secondary | ICD-10-CM | POA: Diagnosis not present

## 2015-07-07 DIAGNOSIS — Z880 Allergy status to penicillin: Secondary | ICD-10-CM | POA: Insufficient documentation

## 2015-07-07 DIAGNOSIS — Z793 Long term (current) use of hormonal contraceptives: Secondary | ICD-10-CM | POA: Insufficient documentation

## 2015-07-07 DIAGNOSIS — O23591 Infection of other part of genital tract in pregnancy, first trimester: Secondary | ICD-10-CM | POA: Insufficient documentation

## 2015-07-07 DIAGNOSIS — O9989 Other specified diseases and conditions complicating pregnancy, childbirth and the puerperium: Secondary | ICD-10-CM | POA: Diagnosis present

## 2015-07-07 DIAGNOSIS — N3090 Cystitis, unspecified without hematuria: Secondary | ICD-10-CM

## 2015-07-07 DIAGNOSIS — Z3490 Encounter for supervision of normal pregnancy, unspecified, unspecified trimester: Secondary | ICD-10-CM

## 2015-07-07 DIAGNOSIS — R102 Pelvic and perineal pain: Secondary | ICD-10-CM

## 2015-07-07 DIAGNOSIS — O99511 Diseases of the respiratory system complicating pregnancy, first trimester: Secondary | ICD-10-CM | POA: Insufficient documentation

## 2015-07-07 DIAGNOSIS — Z79899 Other long term (current) drug therapy: Secondary | ICD-10-CM | POA: Diagnosis not present

## 2015-07-07 DIAGNOSIS — N760 Acute vaginitis: Secondary | ICD-10-CM

## 2015-07-07 DIAGNOSIS — Z3A01 Less than 8 weeks gestation of pregnancy: Secondary | ICD-10-CM | POA: Diagnosis not present

## 2015-07-07 DIAGNOSIS — B9689 Other specified bacterial agents as the cause of diseases classified elsewhere: Secondary | ICD-10-CM

## 2015-07-07 HISTORY — DX: Complete or unspecified spontaneous abortion without complication: O03.9

## 2015-07-07 LAB — WET PREP, GENITAL
Sperm: NONE SEEN
Trich, Wet Prep: NONE SEEN
WBC WET PREP: NONE SEEN
Yeast Wet Prep HPF POC: NONE SEEN

## 2015-07-07 LAB — RAPID HIV SCREEN (HIV 1/2 AB+AG)
HIV 1/2 Antibodies: NONREACTIVE
HIV-1 P24 Antigen - HIV24: NONREACTIVE

## 2015-07-07 LAB — COMPREHENSIVE METABOLIC PANEL
ALBUMIN: 3.1 g/dL — AB (ref 3.5–5.0)
ALK PHOS: 51 U/L (ref 38–126)
ALT: 13 U/L — ABNORMAL LOW (ref 14–54)
AST: 20 U/L (ref 15–41)
Anion gap: 8 (ref 5–15)
BILIRUBIN TOTAL: 0.9 mg/dL (ref 0.3–1.2)
CALCIUM: 8.8 mg/dL — AB (ref 8.9–10.3)
CO2: 20 mmol/L — AB (ref 22–32)
Chloride: 106 mmol/L (ref 101–111)
Creatinine, Ser: 0.79 mg/dL (ref 0.44–1.00)
GFR calc Af Amer: >60 mL/min (ref >60)
GFR calc non Af Amer: >60 mL/min (ref >60)
GLUCOSE: 98 mg/dL (ref 65–99)
POTASSIUM: 3.3 mmol/L — AB (ref 3.5–5.1)
Sodium: 134 mmol/L — ABNORMAL LOW (ref 135–145)
TOTAL PROTEIN: 6.7 g/dL (ref 6.5–8.1)

## 2015-07-07 LAB — URINE MICROSCOPIC-ADD ON: RBC / HPF: NONE SEEN RBC/hpf (ref 0–5)

## 2015-07-07 LAB — URINALYSIS, ROUTINE W REFLEX MICROSCOPIC
BILIRUBIN URINE: NEGATIVE
GLUCOSE, UA: NEGATIVE mg/dL
HGB URINE DIPSTICK: NEGATIVE
KETONES UR: NEGATIVE mg/dL
Leukocytes, UA: NEGATIVE
NITRITE: POSITIVE — AB
PH: 6.5 (ref 5.0–8.0)
Protein, ur: NEGATIVE mg/dL
Specific Gravity, Urine: 1.024 (ref 1.005–1.030)

## 2015-07-07 LAB — CBC
HEMATOCRIT: 32.1 % — AB (ref 36.0–46.0)
Hemoglobin: 11.4 g/dL — ABNORMAL LOW (ref 12.0–15.0)
MCH: 32.9 pg (ref 26.0–34.0)
MCHC: 35.5 g/dL (ref 30.0–36.0)
MCV: 92.5 fL (ref 78.0–100.0)
Platelets: 339 10*3/uL (ref 150–400)
RBC: 3.47 MIL/uL — ABNORMAL LOW (ref 3.87–5.11)
RDW: 11.5 % (ref 11.5–15.5)
WBC: 7.2 10*3/uL (ref 4.0–10.5)

## 2015-07-07 LAB — HCG, SERUM, QUALITATIVE: Preg, Serum: POSITIVE — AB

## 2015-07-07 LAB — LIPASE, BLOOD: LIPASE: 26 U/L (ref 11–51)

## 2015-07-07 LAB — PREGNANCY, URINE: PREG TEST UR: POSITIVE — AB

## 2015-07-07 MED ORDER — FOSFOMYCIN TROMETHAMINE 3 G PO PACK: 3.0000 g | PACK | Freq: Once | ORAL | Status: AC

## 2015-07-07 MED ORDER — METRONIDAZOLE 500 MG PO TABS: 500.0000 mg | ORAL_TABLET | Freq: Two times a day (BID) | ORAL | Status: AC

## 2015-07-07 NOTE — ED Provider Notes (Signed)
CSN: [id number]     Arrival date & time 07/07/15  1431 History   First MD Initiated Contact with Patient 07/07/15 1724     Chief Complaint  Patient presents with  . Abdominal Pain     (Consider location/radiation/quality/duration/timing/severity/associated sxs/prior Treatment) HPI Comments: Storm Dulski is a 20 y.o. Female G1P0010 with history of asthma presents to ED with complaint of lower abdominal pain and positive home pregnancy test. Lower abdominal pain started one week ago, 7/10 on pain scale, sharp in nature, intermittent, and with no radiation. Patient took a home pregnancy test prior to coming to ED and test was positive. LMP 06/03/15. Patient denies any vaginal pain, pelvic pain, vaginal bleeding, or discharge. Patient reports one previous pregnancy resulting in elective D&C approximately 1.5 years ago. She is sexually active with two female partners in the last 6 months. She uses condoms with one partner, no contraceptive methods with other partner. Patient denies history of sexually transmitted infections, but would like to be tested today. She denies any other gynecologic history of surgeries. No abdominal surgeries.   Endorses dark color to urine, increase frequency, and malodorous urine. Denies dysuria. No fever, chills, or night sweats. No diarrhea or constipation.   Patient is a 20 y.o. female presenting with abdominal pain. The history is provided by the patient.  Abdominal Pain Pain location:  Suprapubic Pain quality: sharp   Pain radiates to:  Does not radiate Duration:  1 week Timing:  Intermittent Associated symptoms: no chest pain, no chills, no constipation, no diarrhea, no dysuria, no fever, no hematuria, no nausea, no shortness of breath, no sore throat, no vaginal bleeding, no vaginal discharge and no vomiting     Past Medical History  Diagnosis Date  . Abortion     elective   History reviewed. No pertinent past surgical history. No family history on  file. Social History  Substance Use Topics  . Smoking status: Never Smoker   . Smokeless tobacco: None  . Alcohol Use: Yes     Comment: occ   OB History    No data available     Review of Systems  Constitutional: Negative for fever, chills and diaphoresis.  HENT: Negative for sore throat and trouble swallowing.   Eyes: Negative for visual disturbance.  Respiratory: Negative for shortness of breath.   Cardiovascular: Negative for chest pain.  Gastrointestinal: Positive for abdominal pain. Negative for nausea, vomiting, diarrhea, constipation and blood in stool.  Genitourinary: Positive for frequency. Negative for dysuria, hematuria, vaginal bleeding, vaginal discharge, vaginal pain and pelvic pain.  Musculoskeletal: Negative for neck pain and neck stiffness.  Skin: Negative for rash.  Neurological: Negative for dizziness, light-headedness and headaches.      Allergies  Penicillins  Home Medications   Prior to Admission medications   Medication Sig Start Date End Date Taking? Authorizing Provider  albuterol (PROVENTIL HFA;VENTOLIN HFA) 108 (90 BASE) MCG/ACT inhaler Inhale 1-2 puffs into the lungs every 6 (six) hours as needed for shortness of breath.   Yes Historical Provider, MD  cetirizine (ZYRTEC) 10 MG tablet Take 10 mg by mouth daily.   Yes Historical Provider, MD  diphenhydrAMINE (BENADRYL) 25 mg capsule Take 50 mg by mouth daily as needed for allergies.   Yes Historical Provider, MD  Norgestimate-Ethinyl Estradiol Triphasic 0.18/0.215/0.25 MG-35 MCG tablet Take 1 tablet by mouth daily. 01/18/15  Yes Historical Provider, MD  Pediatric Multiple Vit-C-FA (FLINSTONES GUMMIES OMEGA-3 DHA) CHEW Chew 1 tablet by mouth daily.   Yes  Historical Provider, MD  fosfomycin (MONUROL) 3 g PACK Take 3 g by mouth once. 07/07/15   Lona Kettle, PA-C  metroNIDAZOLE (FLAGYL) 500 MG tablet Take 1 tablet (500 mg total) by mouth 2 (two) times daily. 07/07/15   Lona Kettle, PA-C    BP 117/88 mmHg  Pulse 84  Temp(Src) 98.6 F (37 C) (Oral)  Resp 16  Ht 5\' 1"  (1.549 m)  Wt 57.607 kg  BMI 24.01 kg/m2  SpO2 100%  LMP 07/04/2015 Physical Exam  Constitutional: She appears well-developed and well-nourished. No distress.  HENT:  Head: Normocephalic and atraumatic.  Mouth/Throat: Oropharynx is clear and moist. No oropharyngeal exudate.  Eyes: Conjunctivae and EOM are normal. Pupils are equal, round, and reactive to light. Right eye exhibits no discharge. Left eye exhibits no discharge. No scleral icterus.  Neck: Normal range of motion. Neck supple.  Cardiovascular: Normal rate, regular rhythm, normal heart sounds and intact distal pulses.   No murmur heard. Pulmonary/Chest: Effort normal and breath sounds normal. No respiratory distress.  Abdominal: Soft. Bowel sounds are normal. There is tenderness. There is no rebound, no guarding and no CVA tenderness.  No abdominal distension or rashes noted. Positive bowel sounds. Abdomen is soft with mild TTP of LLQ and suprapubic region. No guarding or rigidity.   Genitourinary: Vagina normal. There is no rash, tenderness or injury on the right labia. There is no rash, tenderness, lesion or injury on the left labia. Cervix exhibits discharge. Cervix exhibits no motion tenderness and no friability.  Chaperone was present for duration of exam. No lacerations, lesions, or masses noted on labia. No lacerations, lesions, or masses noted in vaginal cavity. Cervix closed, non-friable, with white discharge noted. No bloody discharge. No CMT on bimanual exam.   Musculoskeletal: Normal range of motion.  Lymphadenopathy:    She has no cervical adenopathy.  Neurological: She is alert. Coordination normal.  Skin: Skin is warm and dry. She is not diaphoretic.  Psychiatric: She has a normal mood and affect. Her behavior is normal.    ED Course  Procedures (including critical care time) Labs Review Labs Reviewed  WET PREP, GENITAL -  Abnormal; Notable for the following:    Clue Cells Wet Prep HPF POC PRESENT (*)    All other components within normal limits  COMPREHENSIVE METABOLIC PANEL - Abnormal; Notable for the following:    Sodium 134 (*)    Potassium 3.3 (*)    CO2 20 (*)    BUN <5 (*)    Calcium 8.8 (*)    Albumin 3.1 (*)    ALT 13 (*)    All other components within normal limits  CBC - Abnormal; Notable for the following:    RBC 3.47 (*)    Hemoglobin 11.4 (*)    HCT 32.1 (*)    All other components within normal limits  URINALYSIS, ROUTINE W REFLEX MICROSCOPIC (NOT AT [facility]) - Abnormal; Notable for the following:    APPearance CLOUDY (*)    Nitrite POSITIVE (*)    All other components within normal limits  URINE MICROSCOPIC-ADD ON - Abnormal; Notable for the following:    Squamous Epithelial / LPF 0-5 (*)    Bacteria, UA MANY (*)    All other components within normal limits  PREGNANCY, URINE - Abnormal; Notable for the following:    Preg Test, Ur POSITIVE (*)    All other components within normal limits  HCG, SERUM, QUALITATIVE - Abnormal; Notable for the following:  Preg, Serum POSITIVE (*)    All other components within normal limits  LIPASE, BLOOD  RAPID HIV SCREEN (HIV 1/2 AB+AG)  RPR  I-STAT BETA HCG BLOOD, ED ([facility], [facility], [facility] ONLY)  GC/CHLAMYDIA PROBE AMP ([place] NOT [facility]    Imaging Review [place] Ob Comp Less 14 Wks  07/07/2015  CLINICAL DATA:  Lower abdominal pain for 1 week. Estimated gestational age by LMP is 4 weeks 6 days. Quantitative beta HCG is 72. EXAM: OBSTETRIC <14 WK US AND TRANSVAGINAL OB US TECHNIQUE: Both transabdominal and transvaginal ultrasound examinations were performed for complete evaluation of the gestation as well as the maternal uterus, adnexal regions, and pelvic cul-de-sac. Transvaginal technique was performed to assess early pregnancy. COMPARISON:  None. FINDINGS: Intrauterine gestational sac: A single ovoid intrauterine gestational sac is identified. Yolk sac:   Yolk sac is present. Embryo:  Fetal pole is not identified. Cardiac Activity: Not identified. MSD: 11.5  mm   6 w   0  d Subchorionic hemorrhage: A small subchorionic hemorrhage is identified. Maternal uterus/adnexae: Uterus is anteverted. No myometrial mass lesions are identified. Cervix is unremarkable. Both ovaries are visualized and appear normal. No abnormal adnexal mass lesions are seen. Minimal free fluid in the pelvis. IMPRESSION: Probable early intrauterine gestational sac with yolk sac, but no fetal pole or cardiac activity yet visualized. Recommend follow-up quantitative B-HCG levels and follow-up US in 14 days to confirm and assess viability. This recommendation follows SRU consensus guidelines: Diagnostic Criteria for Nonviable Pregnancy Early in the First Trimester. Malva Limes Engl J Med 2013; 213:[id number]; 369:1443-51. Electronically Signed   By: Burman NievesWilliam  Stevens M.D.   On: 07/07/2015 22:11   [place] Ob Transvaginal  07/07/2015  CLINICAL DATA:  Lower abdominal pain for 1 week. Estimated gestational age by LMP is 4 weeks 6 days. Quantitative beta HCG is 72. EXAM: OBSTETRIC <14 WK US AND TRANSVAGINAL OB US TECHNIQUE: Both transabdominal and transvaginal ultrasound examinations were performed for complete evaluation of the gestation as well as the maternal uterus, adnexal regions, and pelvic cul-de-sac. Transvaginal technique was performed to assess early pregnancy. COMPARISON:  None. FINDINGS: Intrauterine gestational sac: A single ovoid intrauterine gestational sac is identified. Yolk sac:  Yolk sac is present. Embryo:  Fetal pole is not identified. Cardiac Activity: Not identified. MSD: 11.5  mm   6 w   0  d Subchorionic hemorrhage: A small subchorionic hemorrhage is identified. Maternal uterus/adnexae: Uterus is anteverted. No myometrial mass lesions are identified. Cervix is unremarkable. Both ovaries are visualized and appear normal. No abnormal adnexal mass lesions are seen. Minimal free fluid in the pelvis. IMPRESSION:  Probable early intrauterine gestational sac with yolk sac, but no fetal pole or cardiac activity yet visualized. Recommend follow-up quantitative B-HCG levels and follow-up US in 14 days to confirm and assess viability. This recommendation follows SRU consensus guidelines: Diagnostic Criteria for Nonviable Pregnancy Early in the First Trimester. Malva Limes Engl J Med 2013; 469:6295-28; 369:1443-51. Electronically Signed   By: Burman NievesWilliam  Stevens M.D.   On: 07/07/2015 22:11   I have personally reviewed and evaluated these images and lab results as part of my medical decision-making.   EKG Interpretation None      MDM   Final diagnoses:  Pregnancy  Bacterial vaginosis  Cystitis    Marzetta BoardKiara Lowther Jenkins is a 20 y.o. female presents to ED with complaint of positive home pregnancy test and lower abdominal pain. Patient is afebrile and non-toxic. Vital signs are stable. Positive bowel sounds. Abdomen soft. Positive bowel  sounds. Mild TTP of suprapubic and LLQ region. Positive urine pregnancy and serum hcg. Nitrites and bacteria noted in U/A with positive pregnancy test - will treat with ABX. Mildly low hgb/hct - patient asymptomatic. Will have follow up with PCP. Mild electrolyte abnormalities; compared to previous labs, relatively stable. Pelvic exam shows closed non-friable cervix with white discharge, no bloody discharge. No CMT. Wet prep positive for clue cells - will treat with ABX. HIV negative. RPR, GC/chlamydia pending. US shows 6week0day intrauterine gestational sac, no fetal pole or cardiac activity visualized yet. Doubt ectopic. Advised patient to follow up with OBGYN for repeat hcg testing and repeat ultrasound in 14 days. Provided return precautions for patient. Patient voiced understanding and is agreeable.    Lona Kettle, [place] 07/07/15 2315  Gerhard Munch, MD 07/07/15 2329

## 2015-07-07 NOTE — ED Notes (Addendum)
Pt states periumbilical pain since last week, worse today.  Drank 3 shots liquor last night.  States lmp 4/10.  She took a pregnancy test today and she thinks it said she was pregnant, but she's not sure.

## 2015-07-07 NOTE — Discharge Instructions (Signed)
Read the information below.  Use the prescribed medication as directed.  Please discuss all new medications with your pharmacist.  Please take full course of antibiotics even if feeling better. Be sure to [facility] to [facility] care. You need repeat labwork and a repeat ultrasound in 14 days. You may return to the Emergency Department at any time for worsening condition or any new symptoms that concern you. Return to the ED if you develop fever, chills, nightsweats, abdominal pain, or vaginal bleeding.    First Trimester of Pregnancy The first trimester of pregnancy is from week 1 until the end of week 12 (months 1 through 3). During this time, your baby will begin to develop inside you. At 6-8 weeks, the eyes and face are formed, and the heartbeat can be seen on ultrasound. At the end of 12 weeks, all the baby's organs are formed. Prenatal care is all the medical care you receive before the birth of your baby. Make sure you get good prenatal care and follow all of your doctor's instructions. HOME CARE  Medicines  Take medicine only as told by your doctor. Some medicines are safe and some are not during pregnancy.  Take your prenatal vitamins as told by your doctor.  Take medicine that helps you poop (stool softener) as needed if your doctor says it is okay. Diet  Eat regular, healthy meals.  Your doctor will tell you the amount of weight gain that is right for you.  Avoid raw meat and uncooked cheese.  If you feel sick to your stomach (nauseous) or throw up (vomit):  Eat 4 or 5 small meals a day instead of 3 large meals.  Try eating a few soda crackers.  Drink liquids between meals instead of during meals.  If you have a hard time pooping (constipation):  Eat high-fiber foods like fresh vegetables, fruit, and whole grains.  Drink enough fluids to keep your pee (urine) clear or pale yellow. Activity and Exercise  Exercise only as told by your doctor. Stop  exercising if you have cramps or pain in your lower belly (abdomen) or low back.  Try to avoid standing for long periods of time. Move your legs often if you must stand in one place for a long time.  Avoid heavy lifting.  Wear low-heeled shoes. Sit and stand up straight.  You can have sex unless your doctor tells you not to. Relief of Pain or Discomfort  Wear a good support bra if your breasts are sore.  Take warm water baths (sitz baths) to soothe pain or discomfort caused by hemorrhoids. Use hemorrhoid cream if your doctor says it is okay.  Rest with your legs raised if you have leg cramps or low back pain.  Wear support hose if you have puffy, bulging veins (varicose veins) in your legs. Raise (elevate) your feet for 15 minutes, 3-4 times a day. Limit salt in your diet. Prenatal Care  Schedule your prenatal visits by the twelfth week of pregnancy.  Write down your questions. Take them to your prenatal visits.  Keep all your prenatal visits as told by your doctor. Safety  Wear your seat belt at all times when driving.  Make a list of emergency phone numbers. The list should include numbers for family, friends, the hospital, and police and fire departments. General Tips  Ask your doctor for a referral to a local prenatal class. Begin classes no later than at the start of month 6 of  your pregnancy.  Ask for help if you need counseling or help with nutrition. Your doctor can give you advice or tell you where to go for help.  Do not use hot tubs, steam rooms, or saunas.  Do not douche or use tampons or scented sanitary pads.  Do not cross your legs for long periods of time.  Avoid litter boxes and soil used by cats.  Avoid all smoking, herbs, and alcohol. Avoid drugs not approved by your doctor.  Do not use any tobacco products, including cigarettes, chewing tobacco, and electronic cigarettes. If you need help quitting, ask your doctor. You may get counseling or other  support to help you quit.  Visit your dentist. At home, brush your teeth with a soft toothbrush. Be gentle when you floss. GET HELP IF:  You are dizzy.  You have mild cramps or pressure in your lower belly.  You have a nagging pain in your belly area.  You continue to feel sick to your stomach, throw up, or have watery poop (diarrhea).  You have a bad smelling fluid coming from your vagina.  You have pain with peeing (urination).  You have increased puffiness (swelling) in your face, hands, legs, or ankles. GET HELP RIGHT AWAY IF:   You have a fever.  You are leaking fluid from your vagina.  You have spotting or bleeding from your vagina.  You have very bad belly cramping or pain.  You gain or lose weight rapidly.  You throw up blood. It may look like coffee grounds.  You are around people who have [place] measles, fifth disease, or chickenpox.  You have a very bad headache.  You have shortness of breath.  You have any kind of trauma, such as from a fall or a car accident.   This information is not intended to replace advice given to you by your health care provider. Make sure you discuss any questions you have with your health care provider.   Document Released: 07/29/2007 Document Revised: 03/02/2014 Document Reviewed: 12/20/2012 Elsevier Interactive Patient Education 2016 [employer]  Bacterial Vaginosis Bacterial vaginosis is an infection of the vagina. It happens when too many germs (bacteria) grow in the vagina. Having this infection puts you at risk for getting other infections from sex. Treating this infection can help lower your risk for other infections, such as:   Chlamydia.  Gonorrhea.  HIV.  Herpes. HOME CARE  Take your medicine as told by your doctor.  Finish your medicine even if you start to feel better.  Tell your sex partner that you have an infection. They should see their doctor for treatment.  During treatment:  Avoid sex or use  condoms correctly.  Do not douche.  Do not drink alcohol unless your doctor tells you it is ok.  Do not breastfeed unless your doctor tells you it is ok. GET HELP IF:  You are not getting better after 3 days of treatment.  You have more grey fluid (discharge) coming from your vagina than before.  You have more pain than before.  You have a fever. MAKE SURE YOU:   Understand these instructions.  Will watch your condition.  Will get help right away if you are not doing well or get worse.   This information is not intended to replace advice given to you by your health care provider. Make sure you discuss any questions you have with your health care provider.   Document Released: 11/19/2007 Document Revised: 03/02/2014 Document Reviewed:  09/21/2012 Elsevier Interactive Patient Education [employer]

## 2015-07-08 LAB — GC/CHLAMYDIA PROBE AMP (~~LOC~~) NOT AT ARMC
CHLAMYDIA, DNA PROBE: NEGATIVE
NEISSERIA GONORRHEA: NEGATIVE

## 2015-07-09 LAB — RPR: RPR: NONREACTIVE

## 2015-10-11 IMAGING — US US OB TRANSVAGINAL
1 series · 14 of 28 positions shown · non-contrast
Comparison: None.

CLINICAL DATA: Pregnancy, abdominal pain.

EXAM:
OBSTETRIC <14 WK US AND TRANSVAGINAL OB US
TECHNIQUE: Both transabdominal and transvaginal ultrasound examinations were
performed for complete evaluation of the gestation as well as the
maternal uterus, adnexal regions, and pelvic cul-de-sac.
Transvaginal technique was performed to assess early pregnancy.

[Series 1: us ob transvaginal · 0.21mm/px · 53 acquisitions, 14 frames shown]
[im 2/53]
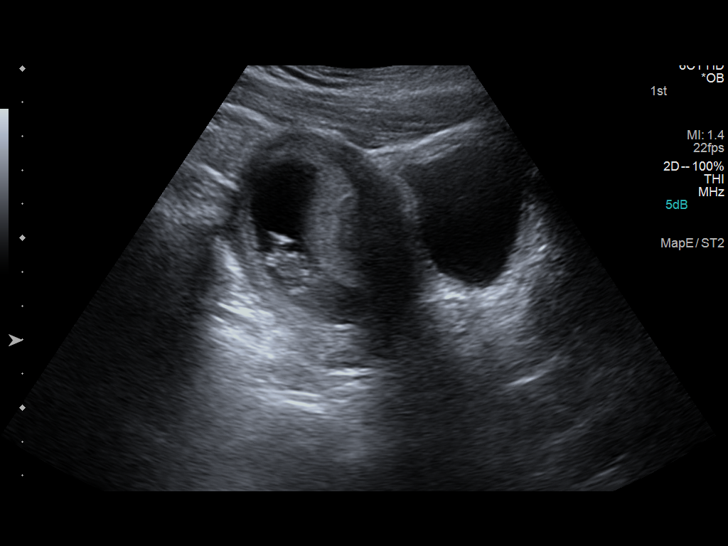
[im 6/53]
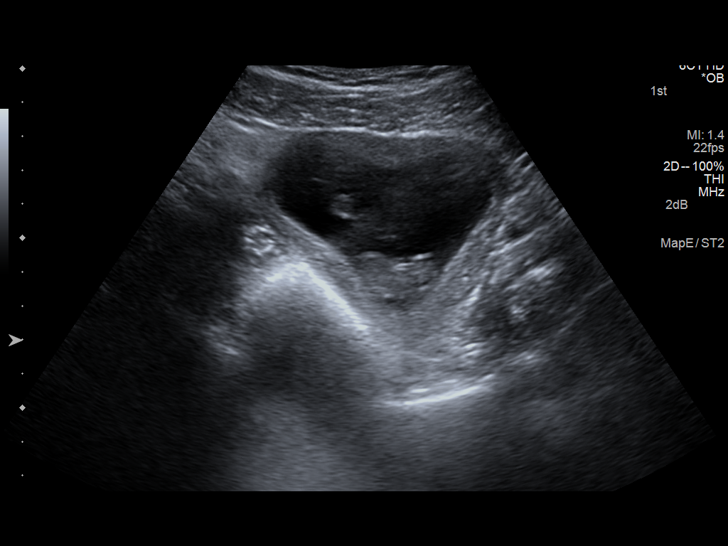
[im 10/53]
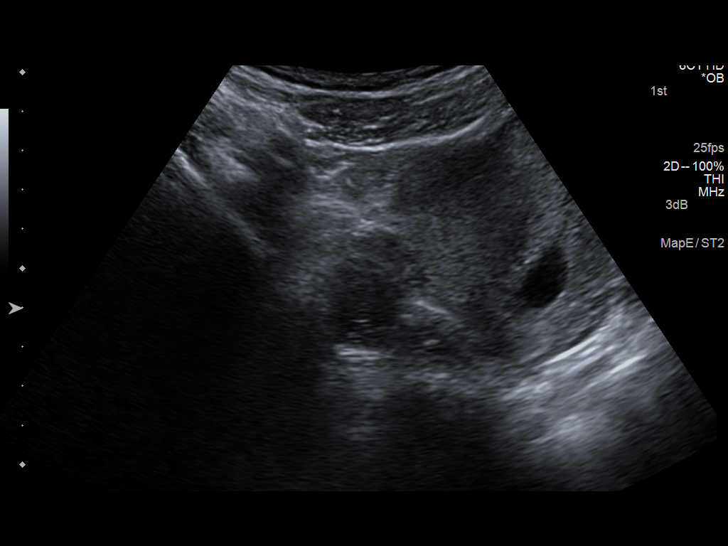
[im 14/53]
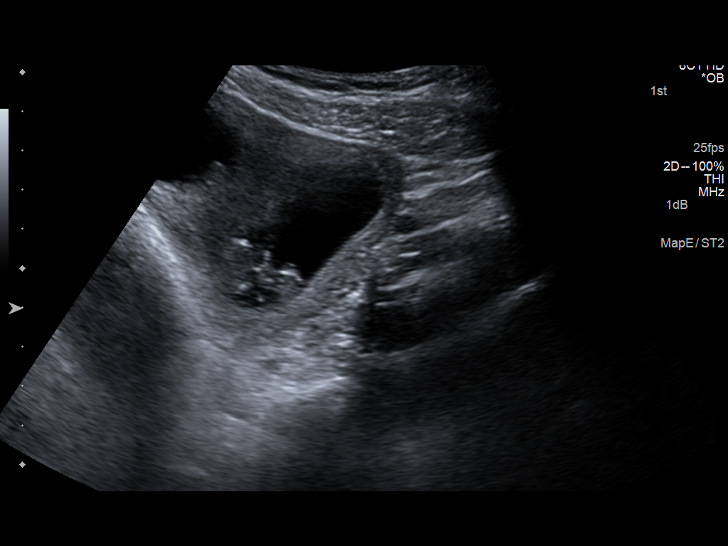
[im 18/53]
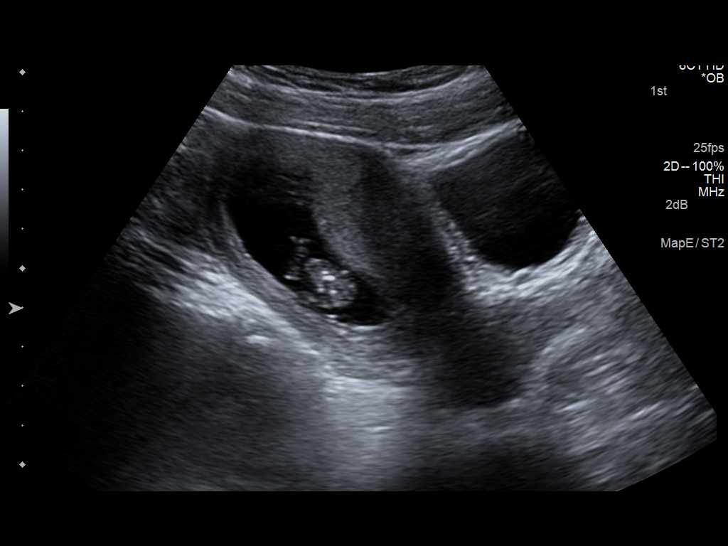
[im 22/53]
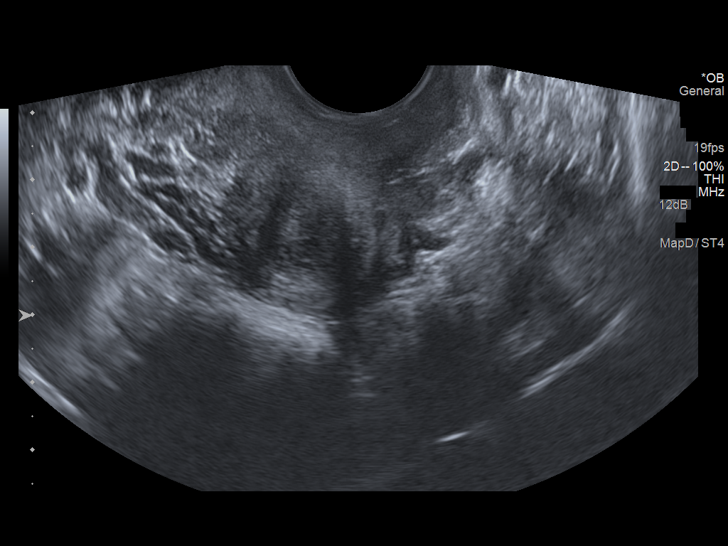
[im 26/53]
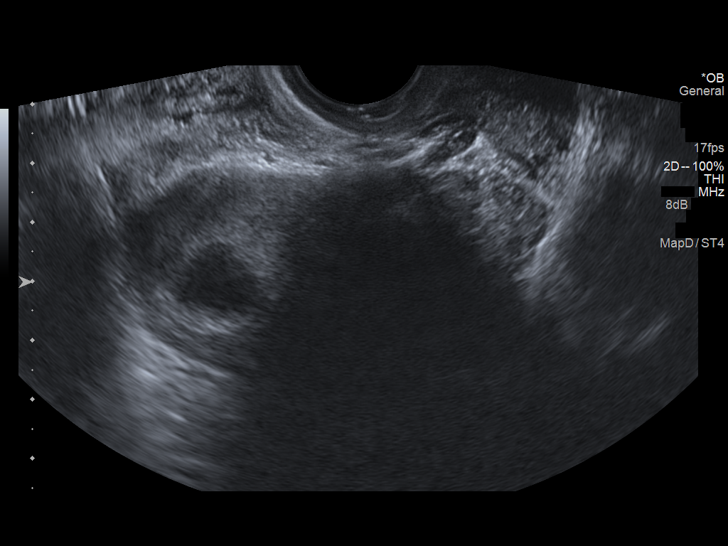
[im 29/53]
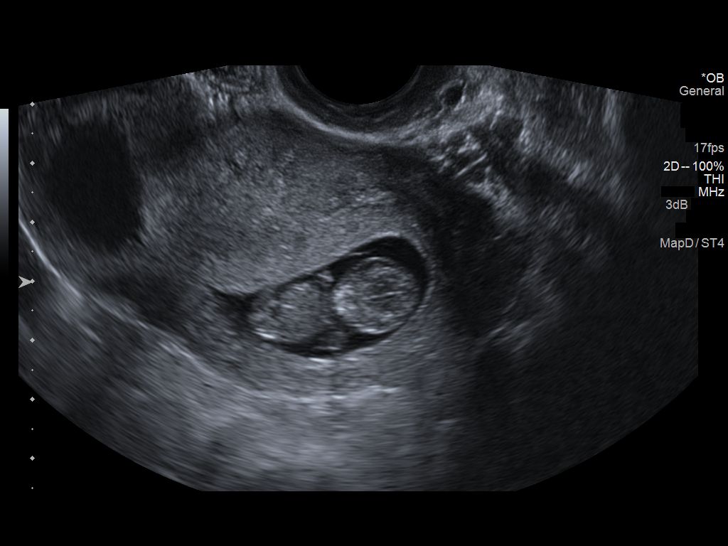
[im 33/53]
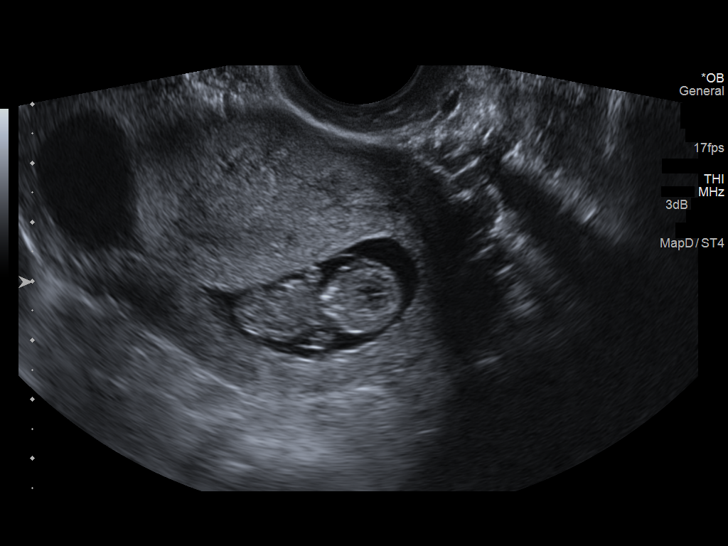
[im 37/53]
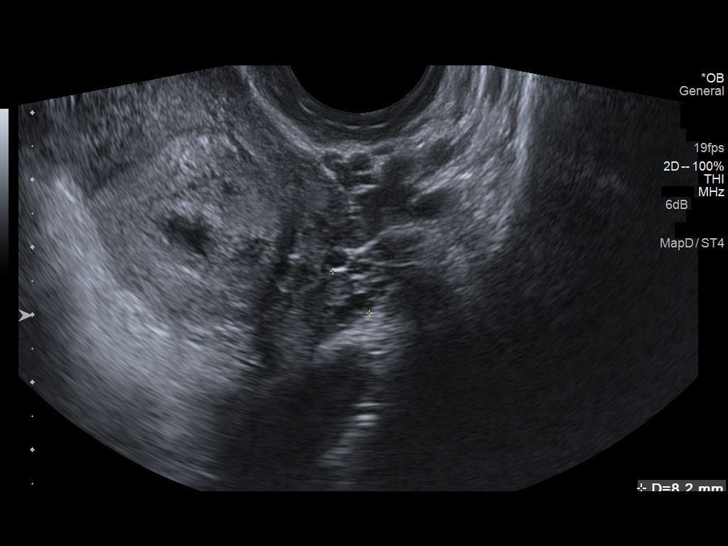
[im 41/53]
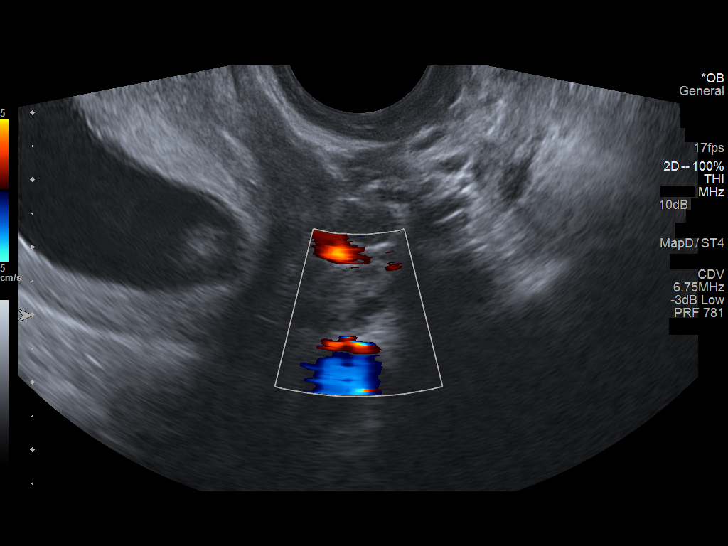
[im 45/53]
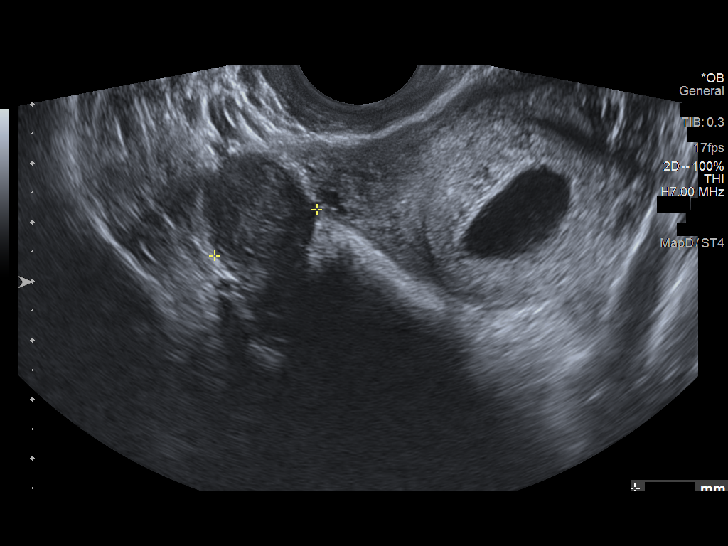
[im 49/53]
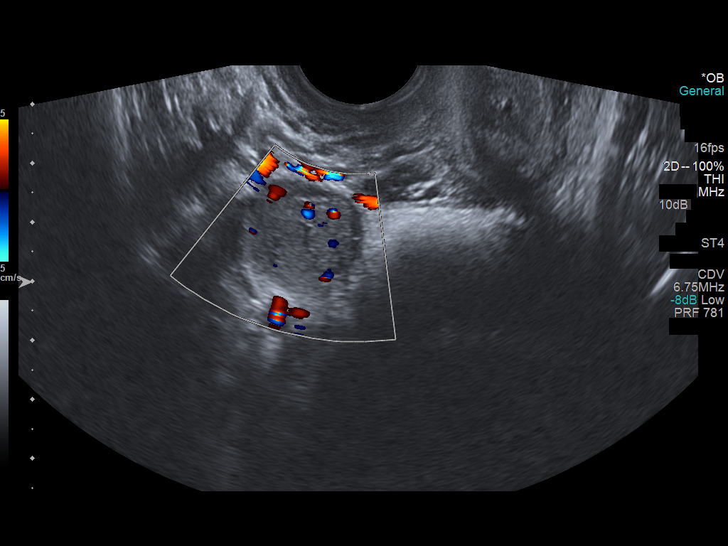
[im 53/53]
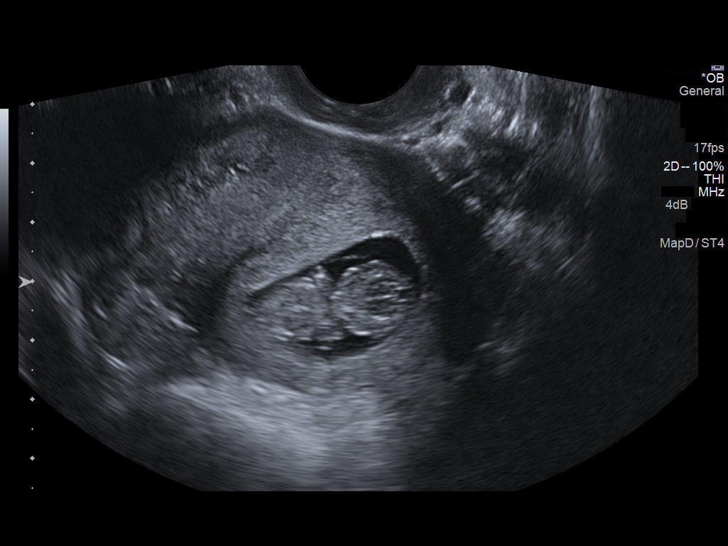

[14 of 28 positions shown; findings below may reference images not displayed]

FINDINGS: Intrauterine gestational sac: Visualized/normal in shape.

Yolk sac:  Visualized.

Embryo:  Visualized.

Cardiac Activity: Visualized.

Heart Rate:  173 bpm

CRL:   28.3  mm   9 w 5 d                  US EDC: August 24, 2014.

Maternal uterus/adnexae: Ovaries appear normal. No hemorrhage is
noted.
IMPRESSION: Single live intrauterine gestation of 9 weeks 5 days.

## 2017-12-04 IMAGING — US US OB TRANSVAGINAL
1 series · 13 of 28 positions shown · non-contrast
Comparison: None.

CLINICAL DATA: Lower abdominal pain for 1 week. Estimated
gestational age by LMP is 4 weeks 6 days. Quantitative beta HCG is
72.

EXAM:
OBSTETRIC <14 WK US AND TRANSVAGINAL OB US
TECHNIQUE: Both transabdominal and transvaginal ultrasound examinations were
performed for complete evaluation of the gestation as well as the
maternal uterus, adnexal regions, and pelvic cul-de-sac.
Transvaginal technique was performed to assess early pregnancy.

[Series 2: us ob transvaginal · 0.19mm/px · 13 of 40 slices shown]
[im 2/40]
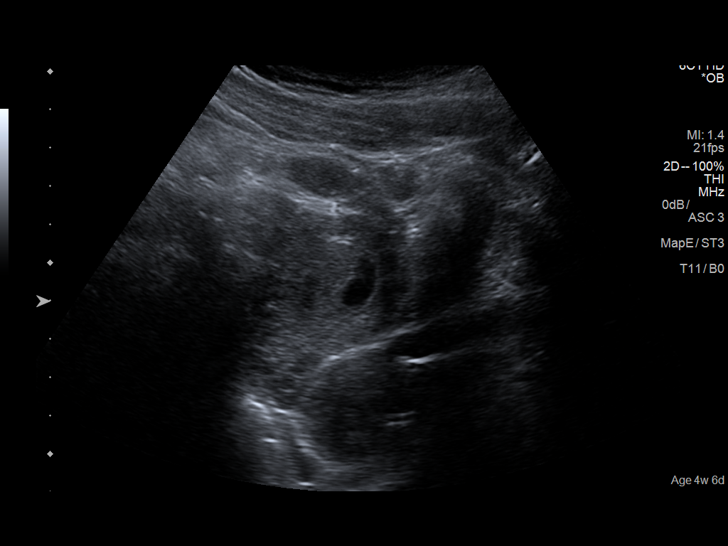
[im 5/40]
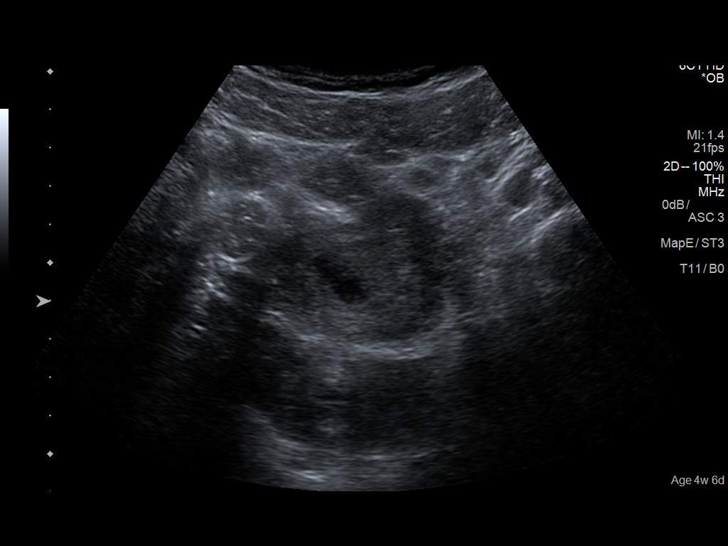
[im 8/40]
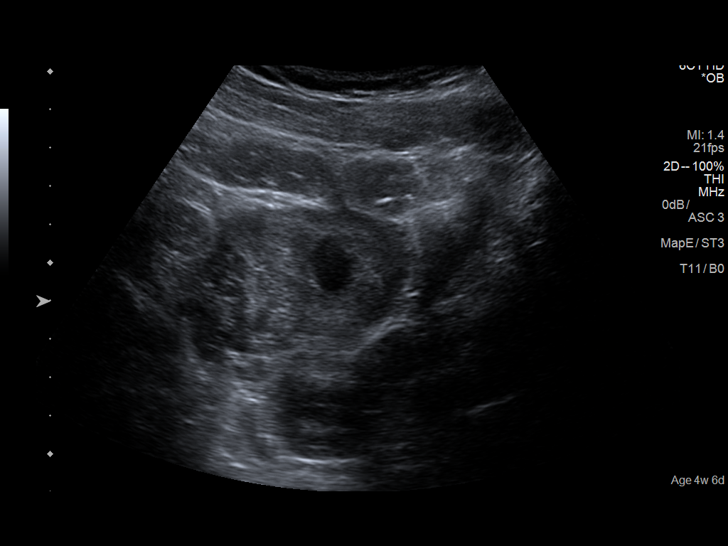
[im 11/40]
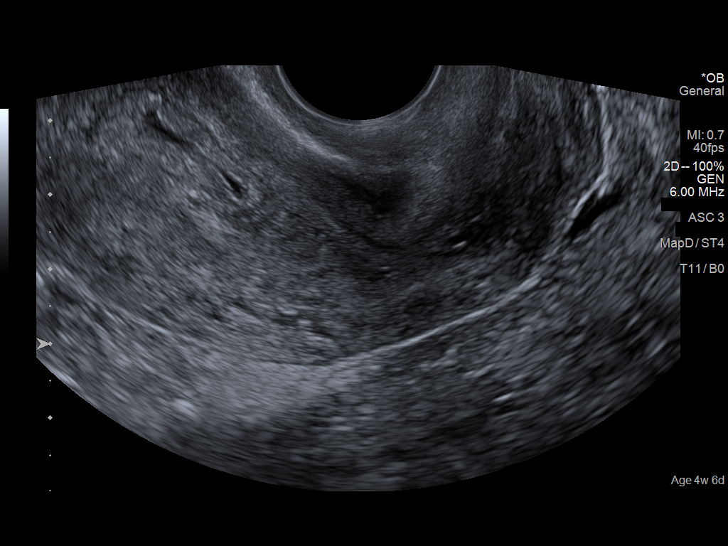
[im 14/40]
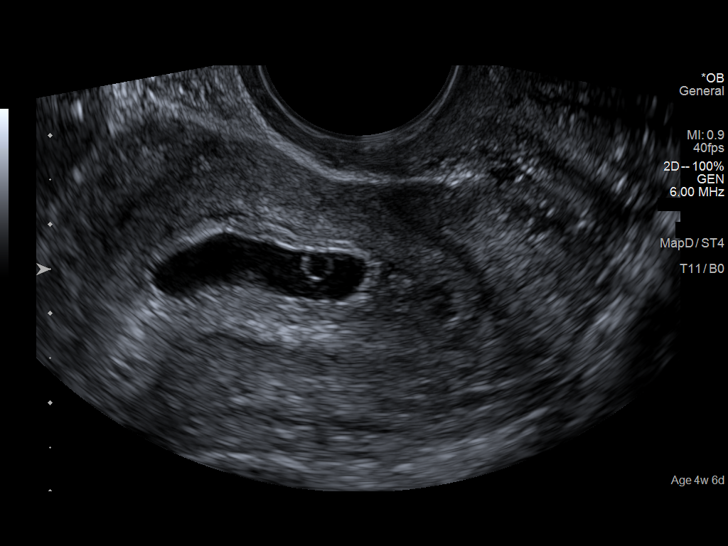
[im 16/40]
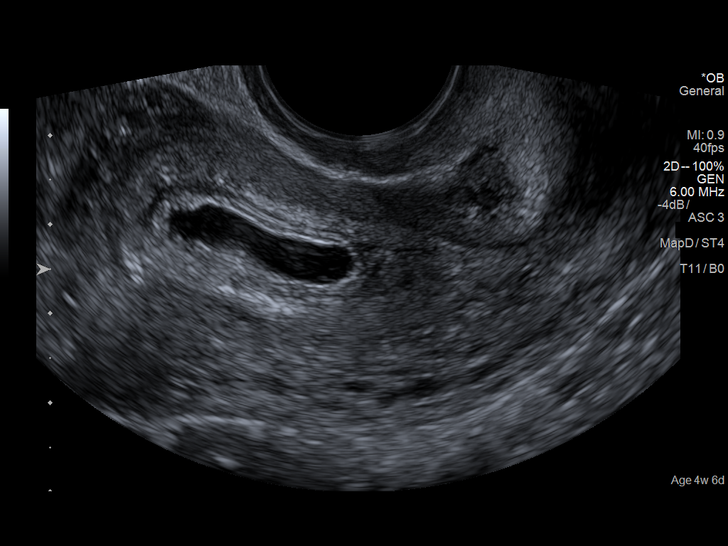
[im 21/40]
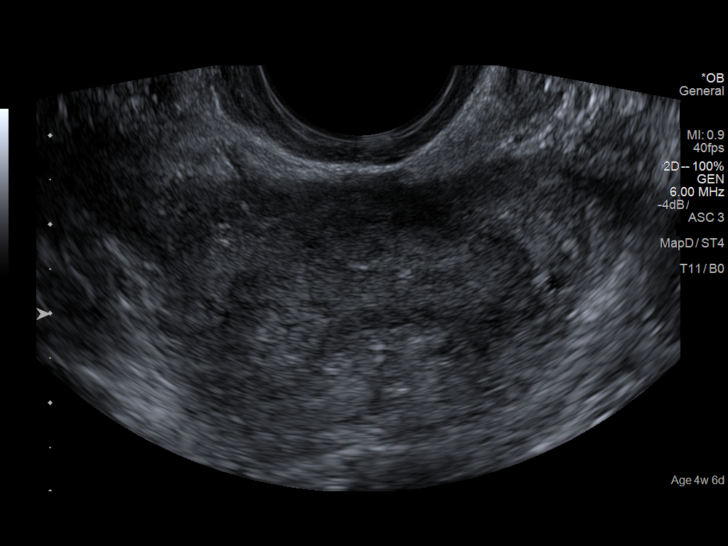
[im 24/40]
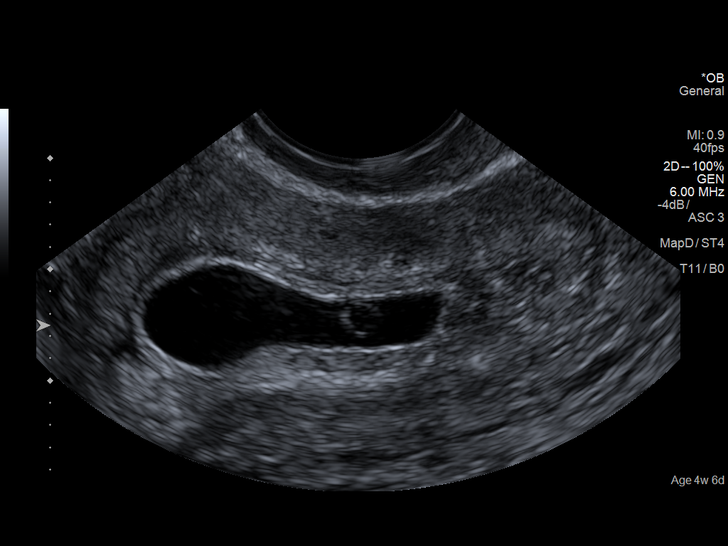
[im 27/40]
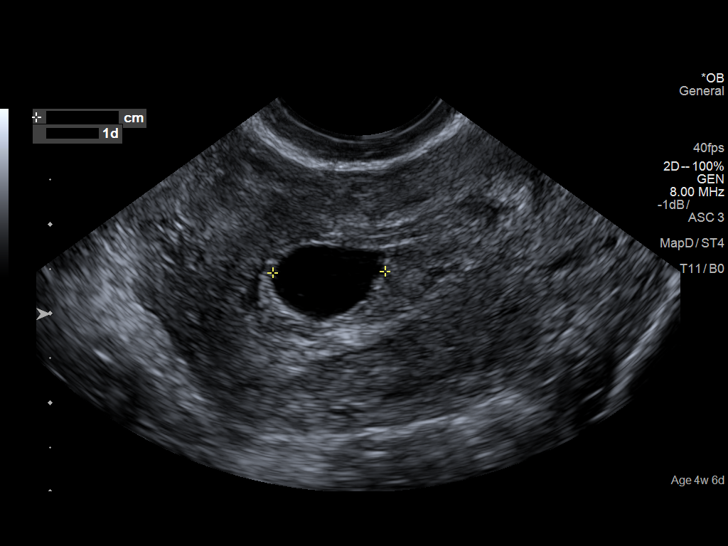
[im 29/40]
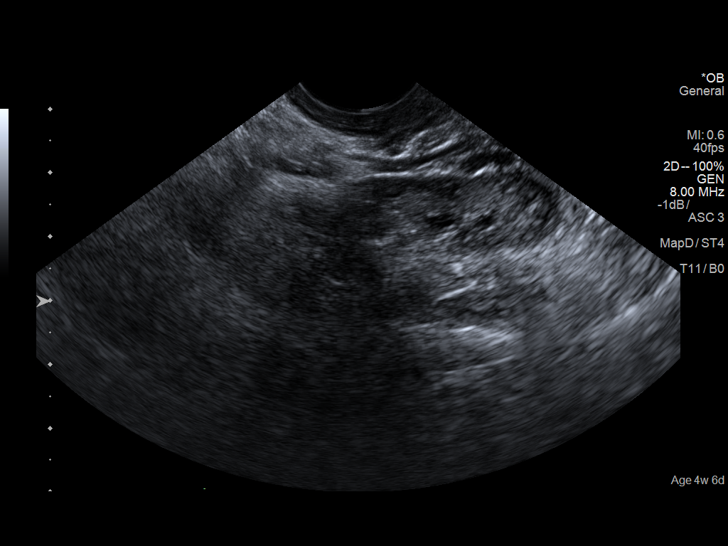
[im 32/40]
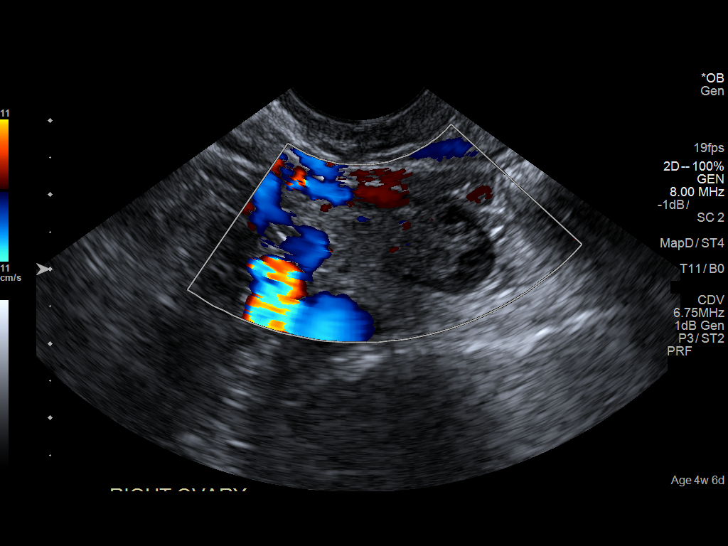
[im 35/40]
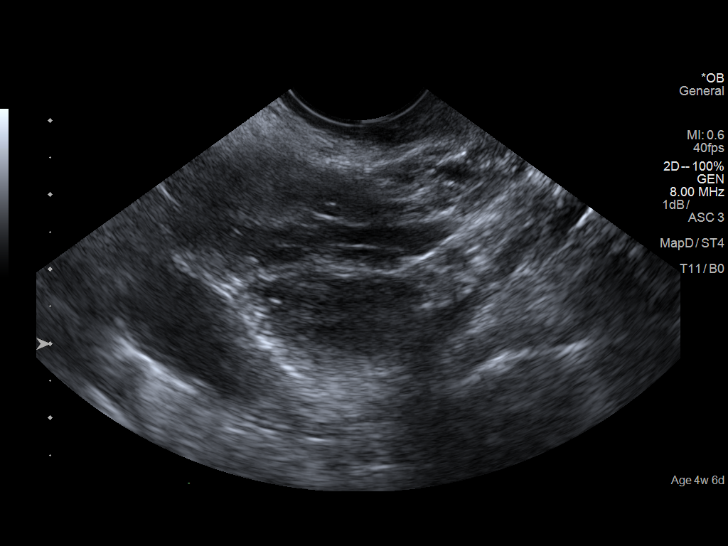
[im 38/40]
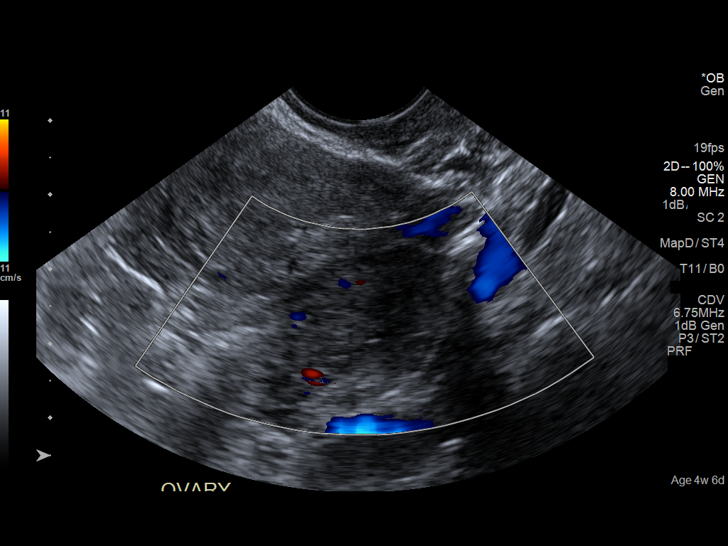

[13 of 28 positions shown; findings below may reference images not displayed]

FINDINGS: Intrauterine gestational sac: A single ovoid intrauterine
gestational sac is identified.

Yolk sac:  Yolk sac is present.

Embryo:  Fetal pole is not identified.

Cardiac Activity: Not identified.

MSD: 11.5  mm   6 w   0  d

Subchorionic hemorrhage: A small subchorionic hemorrhage is
identified.

Maternal uterus/adnexae: Uterus is anteverted. No myometrial mass
lesions are identified. Cervix is unremarkable. Both ovaries are
visualized and appear normal. No abnormal adnexal mass lesions are
seen. Minimal free fluid in the pelvis.
IMPRESSION: Probable early intrauterine gestational sac with yolk sac, but no
fetal pole or cardiac activity yet visualized. Recommend follow-up
quantitative B-HCG levels and follow-up US in 14 days to confirm and
assess viability. This recommendation follows SRU consensus
guidelines: Diagnostic Criteria for Nonviable Pregnancy Early in the
First Trimester. N Engl J Med 5210; [DATE].
